# Patient Record
Sex: Male | Born: 1960 | Race: White | Hispanic: No | Marital: Single | Smoking: Former smoker
Health system: Southern US, Community
[De-identification: ages and names within clinical notes are randomized; demographics above are authoritative.]

## PROBLEM LIST (undated history)

## (undated) DIAGNOSIS — Z860100 Personal history of colon polyps, unspecified: Secondary | ICD-10-CM

## (undated) DIAGNOSIS — E785 Hyperlipidemia, unspecified: Secondary | ICD-10-CM

## (undated) DIAGNOSIS — I509 Heart failure, unspecified: Secondary | ICD-10-CM

## (undated) DIAGNOSIS — N200 Calculus of kidney: Secondary | ICD-10-CM

## (undated) DIAGNOSIS — E119 Type 2 diabetes mellitus without complications: Secondary | ICD-10-CM

## (undated) DIAGNOSIS — M1990 Unspecified osteoarthritis, unspecified site: Secondary | ICD-10-CM

## (undated) DIAGNOSIS — K7689 Other specified diseases of liver: Secondary | ICD-10-CM

## (undated) DIAGNOSIS — N201 Calculus of ureter: Secondary | ICD-10-CM

## (undated) DIAGNOSIS — I4891 Unspecified atrial fibrillation: Secondary | ICD-10-CM

## (undated) HISTORY — PX: HERNIA REPAIR: SHX51

## (undated) HISTORY — DX: Other specified diseases of liver: K76.89

## (undated) HISTORY — DX: Heart failure, unspecified: I50.9

## (undated) HISTORY — DX: Personal history of colonic polyps: Z86.010

## (undated) HISTORY — DX: Calculus of ureter: N20.1

## (undated) HISTORY — DX: Unspecified atrial fibrillation: I48.91

## (undated) HISTORY — DX: Personal history of colon polyps, unspecified: Z86.0100

## (undated) HISTORY — DX: Hyperlipidemia, unspecified: E78.5

## (undated) HISTORY — DX: Type 2 diabetes mellitus without complications: E11.9

---

## 2006-02-23 ENCOUNTER — Ambulatory Visit: Payer: Self-pay | Admitting: General Surgery

## 2006-03-08 ENCOUNTER — Ambulatory Visit: Payer: Self-pay | Admitting: General Surgery

## 2006-08-30 ENCOUNTER — Ambulatory Visit: Payer: Self-pay | Admitting: Family Medicine

## 2010-02-20 ENCOUNTER — Ambulatory Visit: Payer: Self-pay | Admitting: Internal Medicine

## 2010-05-14 ENCOUNTER — Ambulatory Visit: Payer: Self-pay | Admitting: Specialist

## 2010-05-23 ENCOUNTER — Ambulatory Visit: Payer: Self-pay | Admitting: Specialist

## 2010-05-23 HISTORY — PX: ROTATOR CUFF REPAIR: SHX139

## 2010-08-29 ENCOUNTER — Ambulatory Visit: Payer: Self-pay | Admitting: General Surgery

## 2010-08-29 ENCOUNTER — Encounter: Payer: Self-pay | Admitting: Cardiovascular Disease

## 2010-08-29 ENCOUNTER — Ambulatory Visit (INDEPENDENT_AMBULATORY_CARE_PROVIDER_SITE_OTHER): Payer: 59 | Admitting: Cardiovascular Disease

## 2010-08-29 VITALS — BP 120/83 | HR 99 | Ht 74.0 in | Wt 257.0 lb

## 2010-08-29 DIAGNOSIS — I4891 Unspecified atrial fibrillation: Secondary | ICD-10-CM

## 2010-08-29 DIAGNOSIS — E119 Type 2 diabetes mellitus without complications: Secondary | ICD-10-CM

## 2010-08-29 DIAGNOSIS — R9431 Abnormal electrocardiogram [ECG] [EKG]: Secondary | ICD-10-CM

## 2010-08-29 HISTORY — PX: COLONOSCOPY: SHX174

## 2010-08-29 MED ORDER — DABIGATRAN ETEXILATE MESYLATE 150 MG PO CAPS: 150.0000 mg | ORAL_CAPSULE | Freq: Two times a day (BID) | ORAL | Status: DC

## 2010-08-29 NOTE — Progress Notes (Signed)
   Patient ID: David Dorsey, male    DOB: [birth year], 50 y.o.   MRN: [id number]  HPI Comments: David Dorsey is a very pleasant 50 year old gentleman with a history of diabetes who presents after completing his colonoscopy at [facility], noted to be in atrial fibrillation.  He reports that he is asymptomatic, no symptoms of shortness of breath, no chest tightness with exertion, no lightheadedness, no lower extremity edema. Overall he feels well. He is tolerating his diabetes medications with no complaints. He had his colonoscopy today and reports having no polyps removed. This is his second colonoscopy for strong family history of GI disease.  He denies having significant coronary disease in the family.  EKG today shows atrial fibrillation with ventricular rate 84 beats per minute, left anterior fascicular block, rare PVC EKG from earlier today during his colonoscopy shows atrial fibrillation with ventricular rate 61 beats per minute EKG from Dr. Theodis Aguas office March 2012 shows normal sinus rhythm with rare APC, left axis deviation  Notes from earlier in the year from Dr. Theodis Aguas office detail hemoglobin A1c 9.2   Outpatient Encounter Prescriptions as of 08/29/2010  Medication Sig Dispense Refill  . aspirin 81 MG tablet Take 81 mg by mouth daily.        . metFORMIN (GLUMETZA) 500 MG (MOD) 24 hr tablet Take 500 mg by mouth 2 (two) times daily with a meal.          Review of Systems  Constitutional: Negative.   HENT: Negative.   Eyes: Negative.   Respiratory: Negative.   Cardiovascular: Negative.   Gastrointestinal: Negative.   Musculoskeletal: Negative.   Skin: Negative.   Neurological: Negative.   Hematological: Negative.   Psychiatric/Behavioral: Negative.   All other systems reviewed and are negative.     BP 120/83  Pulse 99  Ht 6\' 2"  (1.88 m)  Wt 257 lb (116.574 kg)  BMI 33.00 kg/m2  Physical Exam  Nursing note and vitals reviewed. Constitutional: He is oriented to person,  place, and time. He appears well-developed and well-nourished.  HENT:  Head: Normocephalic.  Nose: Nose normal.  Mouth/Throat: Oropharynx is clear and moist.  Eyes: Conjunctivae are normal. Pupils are equal, round, and reactive to light.  Neck: Normal range of motion. Neck supple. No JVD present.  Cardiovascular: Normal rate, S1 normal, S2 normal, normal heart sounds and intact distal pulses.  An irregularly irregular rhythm present. Exam reveals no gallop and no friction rub.   No murmur heard. Pulmonary/Chest: Effort normal and breath sounds normal. No respiratory distress. He has no wheezes. He has no rales. He exhibits no tenderness.  Abdominal: Soft. Bowel sounds are normal. He exhibits no distension. There is no tenderness.  Musculoskeletal: Normal range of motion. He exhibits no edema and no tenderness.  Lymphadenopathy:    He has no cervical adenopathy.  Neurological: He is alert and oriented to person, place, and time. Coordination normal.  Skin: Skin is warm and dry. No rash noted. No erythema.  Psychiatric: He has a normal mood and affect. His behavior is normal. Judgment and thought content normal.           Assessment and Plan

## 2010-08-29 NOTE — Patient Instructions (Addendum)
We will start pradaxa 150 mg twice a day (start Saturday) Stop the aspirin We have ordered an echocardiogram for atrial fibrillation Please call us if you have new issues that need to be addressed before your next appt.  We will call you for a follow up Appt. In 1 months

## 2010-08-29 NOTE — Assessment & Plan Note (Signed)
He is uncertain when his rhythm converted to atrial fibrillation. He denies any symptoms. For now, we will start him on anticoagulation, pradaxa 150 mg b.i.d.. We have ordered an echocardiogram to determine if he would be a candidate for pharmacologic cardioversion or DC cardioversion in one month's time. We will meet him back at that time. All of the pathology and treatment and anticoagulation options were discussed with him at length.

## 2010-08-29 NOTE — Assessment & Plan Note (Signed)
We have encouraged continued exercise, careful diet management in an effort to lose weight. He has followup with Dr. Sherrie Mustache for medication titration.

## 2010-09-06 HISTORY — PX: CARDIOVERSION: SHX1299

## 2010-09-09 ENCOUNTER — Other Ambulatory Visit (INDEPENDENT_AMBULATORY_CARE_PROVIDER_SITE_OTHER): Payer: 59 | Admitting: *Deleted

## 2010-09-09 DIAGNOSIS — R9431 Abnormal electrocardiogram [ECG] [EKG]: Secondary | ICD-10-CM

## 2010-09-09 DIAGNOSIS — I4891 Unspecified atrial fibrillation: Secondary | ICD-10-CM

## 2010-09-18 ENCOUNTER — Encounter: Payer: Self-pay | Admitting: Cardiovascular Disease

## 2010-09-18 ENCOUNTER — Telehealth: Payer: Self-pay | Admitting: *Deleted

## 2010-09-18 NOTE — Telephone Encounter (Signed)
Pt called to get echo results from 1 week ago. Pt was sent letter, however, the address was incorrect. Gave pt results per letter, changed info. Pt will follow up 10/01/10 to discuss results further with Dr. Mariah Milling.

## 2010-10-01 ENCOUNTER — Encounter: Payer: Self-pay | Admitting: Cardiovascular Disease

## 2010-10-01 ENCOUNTER — Ambulatory Visit (INDEPENDENT_AMBULATORY_CARE_PROVIDER_SITE_OTHER): Payer: 59 | Admitting: Cardiovascular Disease

## 2010-10-01 VITALS — BP 126/89 | HR 99 | Ht 74.0 in | Wt 260.0 lb

## 2010-10-01 DIAGNOSIS — I4891 Unspecified atrial fibrillation: Secondary | ICD-10-CM

## 2010-10-01 DIAGNOSIS — E119 Type 2 diabetes mellitus without complications: Secondary | ICD-10-CM

## 2010-10-01 MED ORDER — AMIODARONE HCL 200 MG PO TABS: 200.0000 mg | ORAL_TABLET | Freq: Two times a day (BID) | ORAL | Status: DC

## 2010-10-01 NOTE — Assessment & Plan Note (Signed)
In an effort to pharmacologically cardiovert him, we will start him on amiodarone 200 mg b.i.d. We will schedule him today for cardioversion at the end of next week. If he has not converted, the amiodarone would certainly help [place] with cardioversion. We'll continue him on anticoagulation for several weeks after cardioversion.

## 2010-10-01 NOTE — Patient Instructions (Signed)
Please start amiodarone 200 mg twice a day We will call you to schedule the cardioversion at the end of next week  Please call us if you have new issues that need to be addressed before your next appt.  We will call you for a follow up Appt. In 1 months

## 2010-10-01 NOTE — Progress Notes (Signed)
   Patient ID: David Dorsey, male    DOB: 1960/05/24, 50 y.o.   MRN: [id number]  HPI Comments: David Dorsey is a very pleasant 50 year old gentleman with a history of diabetes who presents after completing his colonoscopy at [facility], noted to be in atrial fibrillation.  On his last clinic visit, he did not need any significant medications for rate control as heart rate was relatively well controlled. He is asymptomatic in general. We did start him on anticoagulation one month ago. We did perform an echocardiogram that showed mildly dilated left atrium, moderately reduced systolic function which I suspect could be secondary to his underlying arrhythmia.  He denies having significant coronary disease in the family.  EKG today shows atrial fibrillation with ventricular rate 79 beats per minute, no significant ST or T wave changes, left anterior fascicular block  Notes from earlier in the year from Dr. Theodis Aguas office detail hemoglobin A1c 9.2   Outpatient Encounter Prescriptions as of 10/01/2010  Medication Sig Dispense Refill  . dabigatran (PRADAXA) 150 MG CAPS Take 1 capsule (150 mg total) by mouth every 12 (twelve) hours.  60 capsule  6  . metFORMIN (GLUMETZA) 500 MG (MOD) 24 hr tablet Take 500 mg by mouth 2 (two) times daily with a meal.        . DISCONTD: aspirin 81 MG tablet Take 81 mg by mouth daily.           Review of Systems  Constitutional: Negative.   HENT: Negative.   Eyes: Negative.   Respiratory: Negative.   Cardiovascular: Negative.   Gastrointestinal: Negative.   Musculoskeletal: Negative.   Skin: Negative.   Neurological: Negative.   Hematological: Negative.   Psychiatric/Behavioral: Negative.   All other systems reviewed and are negative.     BP 126/89  Pulse 99  Ht 6\' 2"  (1.88 m)  Wt 260 lb (117.935 kg)  BMI 33.38 kg/m2  Physical Exam  Nursing note and vitals reviewed. Constitutional: He is oriented to person, place, and time. He appears well-developed and  well-nourished.  HENT:  Head: Normocephalic.  Nose: Nose normal.  Mouth/Throat: Oropharynx is clear and moist.  Eyes: Conjunctivae are normal. Pupils are equal, round, and reactive to light.  Neck: Normal range of motion. Neck supple. No JVD present.  Cardiovascular: Normal rate, S1 normal, S2 normal, normal heart sounds and intact distal pulses.  An irregularly irregular rhythm present. Exam reveals no gallop and no friction rub.   No murmur heard. Pulmonary/Chest: Effort normal and breath sounds normal. No respiratory distress. He has no wheezes. He has no rales. He exhibits no tenderness.  Abdominal: Soft. Bowel sounds are normal. He exhibits no distension. There is no tenderness.  Musculoskeletal: Normal range of motion. He exhibits no edema and no tenderness.  Lymphadenopathy:    He has no cervical adenopathy.  Neurological: He is alert and oriented to person, place, and time. Coordination normal.  Skin: Skin is warm and dry. No rash noted. No erythema.  Psychiatric: He has a normal mood and affect. His behavior is normal. Judgment and thought content normal.           Assessment and Plan

## 2010-10-01 NOTE — Assessment & Plan Note (Signed)
We have encouraged continued exercise, careful diet management in an effort to lose weight. 

## 2010-10-06 ENCOUNTER — Encounter: Payer: Self-pay | Admitting: *Deleted

## 2010-10-06 ENCOUNTER — Other Ambulatory Visit: Payer: Self-pay | Admitting: Cardiovascular Disease

## 2010-10-06 DIAGNOSIS — R9431 Abnormal electrocardiogram [ECG] [EKG]: Secondary | ICD-10-CM

## 2010-10-06 DIAGNOSIS — I4891 Unspecified atrial fibrillation: Secondary | ICD-10-CM

## 2010-10-06 DIAGNOSIS — Z01810 Encounter for preprocedural cardiovascular examination: Secondary | ICD-10-CM

## 2010-10-14 ENCOUNTER — Ambulatory Visit (INDEPENDENT_AMBULATORY_CARE_PROVIDER_SITE_OTHER): Payer: 59 | Admitting: *Deleted

## 2010-10-14 ENCOUNTER — Other Ambulatory Visit: Payer: 59 | Admitting: *Deleted

## 2010-10-14 ENCOUNTER — Encounter: Payer: 59 | Admitting: *Deleted

## 2010-10-14 DIAGNOSIS — I4891 Unspecified atrial fibrillation: Secondary | ICD-10-CM

## 2010-10-14 DIAGNOSIS — R9431 Abnormal electrocardiogram [ECG] [EKG]: Secondary | ICD-10-CM

## 2010-10-14 DIAGNOSIS — Z01810 Encounter for preprocedural cardiovascular examination: Secondary | ICD-10-CM

## 2010-10-14 LAB — BASIC METABOLIC PANEL
BUN: 13 mg/dL (ref 6–23)
CO2: 24 mEq/L (ref 19–32)
Chloride: 107 mEq/L (ref 96–112)
Creat: 0.96 mg/dL (ref 0.50–1.35)
Glucose, Bld: 145 mg/dL — ABNORMAL HIGH (ref 70–99)

## 2010-10-14 LAB — PROTIME-INR: Prothrombin Time: 15.5 seconds — ABNORMAL HIGH (ref 11.6–15.2)

## 2010-10-15 LAB — CBC WITH DIFFERENTIAL/PLATELET
Basophils Relative: 1 % (ref 0–1)
Eosinophils Absolute: 0.2 10*3/uL (ref 0.0–0.7)
MCH: 29 pg (ref 26.0–34.0)
MCHC: 35.3 g/dL (ref 30.0–36.0)
Monocytes Relative: 8 % (ref 3–12)
Neutrophils Relative %: 53 % (ref 43–77)
Platelets: 197 10*3/uL (ref 150–400)
RDW: 13.1 % (ref 11.5–15.5)

## 2010-10-16 ENCOUNTER — Ambulatory Visit: Payer: Self-pay | Admitting: Cardiovascular Disease

## 2010-10-16 DIAGNOSIS — I4891 Unspecified atrial fibrillation: Secondary | ICD-10-CM

## 2010-10-31 ENCOUNTER — Ambulatory Visit: Payer: 59 | Admitting: Cardiovascular Disease

## 2010-11-07 ENCOUNTER — Encounter: Payer: Self-pay | Admitting: Cardiovascular Disease

## 2011-02-04 ENCOUNTER — Encounter: Payer: Self-pay | Admitting: Cardiovascular Disease

## 2011-02-06 ENCOUNTER — Encounter: Payer: Self-pay | Admitting: Cardiovascular Disease

## 2011-02-06 ENCOUNTER — Encounter: Payer: Self-pay | Admitting: *Deleted

## 2011-02-06 ENCOUNTER — Ambulatory Visit (INDEPENDENT_AMBULATORY_CARE_PROVIDER_SITE_OTHER): Payer: 59 | Admitting: Cardiovascular Disease

## 2011-02-06 VITALS — BP 107/73 | HR 110 | Ht 74.0 in | Wt 255.0 lb

## 2011-02-06 DIAGNOSIS — I4891 Unspecified atrial fibrillation: Secondary | ICD-10-CM

## 2011-02-06 DIAGNOSIS — R9431 Abnormal electrocardiogram [ECG] [EKG]: Secondary | ICD-10-CM

## 2011-02-06 DIAGNOSIS — I509 Heart failure, unspecified: Secondary | ICD-10-CM

## 2011-02-06 MED ORDER — CARVEDILOL 3.125 MG PO TABS: 3.1250 mg | ORAL_TABLET | Freq: Two times a day (BID) | ORAL | Status: DC

## 2011-02-06 MED ORDER — DABIGATRAN ETEXILATE MESYLATE 150 MG PO CAPS: 150.0000 mg | ORAL_CAPSULE | Freq: Two times a day (BID) | ORAL | Status: DC

## 2011-02-06 NOTE — Assessment & Plan Note (Signed)
David Dorsey presents with recurrent atrial fibrillation. He thinks that he may have been in atrial fibrillation for the past several months. He suspects that he went back into A. fib shortly after his cardioversion.  He's not particular symptomatic I can't feel that his heart is beating a little regularly. He his rate is fairly well controlled I don't think that it is contributing to any heart failure.   He has an ejection fraction of 35-40% so flecainide is not a good option.  I've asked him to restart his Pradaxa at 150 mg BID.  He may be a candidate for Tikosyn.  At his relatively young age of 47, I would not necessarily want  to commit him to long-term amiodarone therapy.  We'll start him on carvedilol 3.125 mg twice a day which will help with rate control and will also help with his congestive heart failure. He will return to see [place] in the office in several weeks.

## 2011-02-06 NOTE — Assessment & Plan Note (Signed)
His ejection fraction by echo was 35-40%. He has a long history of poorly controlled diabetes mellitus. He's never had any episodes of chest pain but we certainly need to rule out ischemic heart disease.  He may have some ischemia.  We'll schedule him for a 2 day stress Myoview study.  He may need a cardiac catheterization. Dr. Windell Hummingbird original thoughts were that the congestive heart failure was due to rapid atrial fibrillation.  At this point, his rate is fairly well controlled and I do not think that it is contributing to any further decline in his heart function.  We will start him on carvedilol 3.125 mg a day. His blood pressure is marginal and I do not think it we can start him on ACE inhibitor today.  We will gradually titrate up his carvedilol.  I've asked him to limit his salt intake.

## 2011-02-06 NOTE — Patient Instructions (Signed)
Your physician has recommended you make the following change in your medication: Pradaxa 150 TWICE daily.  START Coreg 3.125 TWICE Daily.  IN [place]: Your physician has requested that you have en exercise stress myoview. For further information please visit [email or web address] Please follow instruction sheet, as given.    Your physician recommends that you schedule a follow-up appointment in: 2 WEEKS WITH DR. Mariah Milling

## 2011-02-06 NOTE — Progress Notes (Signed)
    David Dorsey Date of Birth  1960-10-13 [facility]     [place] [facility]  [address]    [address]   [address] [place], [place]  [address]    [place], [place]  [address] [phone]  Fax  [phone]  [phone]  Fax [phone]  Problem list: 1. Atrial fibrillation 2. Diabetes mellitus   History of Present Illness:  David Dorsey is a 51 yo with hx of Atrial Fibrillation in the past.  He also has a hx of DM.  He was seen by his medical doctor and was found to be in atrial fibrillation.  He's not had any severe chest pain or shortness breath but has noticed that he doesn't feel quite as strong as he normally does. He also gets a sensation that his heart is not in rhythm.  He took an amiodarone this morning because he felt that he was out of rhythm.  He works very actively as a Production designer, theatre/television/film at [employer].    Current Outpatient Prescriptions on File Prior to Visit  Medication Sig Dispense Refill  . metFORMIN (GLUMETZA) 500 MG (MOD) 24 hr tablet Take 500 mg by mouth 2 (two) times daily with a meal.          No Known Allergies  Past Medical History  Diagnosis Date  . Diabetes mellitus   . Type II or unspecified type diabetes mellitus without mention of complication, not stated as uncontrolled   . Personal history of colonic polyps   . Calculus of ureter   . Other chronic nonalcoholic liver disease   . Hyperlipidemia   . Atrial fibrillation     Past Surgical History  Procedure Date  . Rotator cuff repair May 23, 2010    right  . Colonoscopy 08/29/2010  . Cardioversion 09/2010    History  Smoking status  . Former Smoker -- 1.0 packs/day for 15 years  . Types: Cigarettes  . Quit date: 08/29/1998  Smokeless tobacco  . Not on file    History  Alcohol Use  . 0.5 oz/week  . 1 drink(s) per week    History reviewed. No pertinent family history.  Reviw of Systems:  Reviewed in the HPI.  All other systems are negative.  Physical  Exam: Blood pressure 107/73, pulse 110, height 6\' 2"  (1.88 m), weight 255 lb (115.667 kg). General: Well developed, well nourished, in no acute distress.  Head: Normocephalic, atraumatic, sclera non-icteric, mucus membranes are moist,   Neck: Supple. Negative for carotid bruits. JVD not elevated.  Lungs: Clear bilaterally to auscultation without wheezes, rales, or rhonchi. Breathing is unlabored.  Heart: Irregularly irregular with S1 S2. No murmurs, rubs, or gallops appreciated.  Abdomen: Soft, non-tender, non-distended with normoactive bowel sounds. No hepatomegaly. No rebound/guarding. No obvious abdominal masses.  Msk:  Strength and tone appear normal for age.  Extremities: No clubbing or cyanosis. No edema.  Distal pedal pulses are 2+ and equal bilaterally.  Neuro: Alert and oriented X 3. Moves all extremities spontaneously.  Psych:  Responds to questions appropriately with a normal affect.  ECG: Atrial fibrillation with a controlled ventricular response  Assessment / Plan:

## 2011-02-24 ENCOUNTER — Ambulatory Visit (HOSPITAL_COMMUNITY): Payer: 59 | Attending: Cardiology | Admitting: Radiology

## 2011-02-24 VITALS — BP 121/89 | Ht 74.0 in | Wt 252.0 lb

## 2011-02-24 DIAGNOSIS — Z87891 Personal history of nicotine dependence: Secondary | ICD-10-CM | POA: Insufficient documentation

## 2011-02-24 DIAGNOSIS — E669 Obesity, unspecified: Secondary | ICD-10-CM | POA: Insufficient documentation

## 2011-02-24 DIAGNOSIS — E119 Type 2 diabetes mellitus without complications: Secondary | ICD-10-CM | POA: Insufficient documentation

## 2011-02-24 DIAGNOSIS — R0989 Other specified symptoms and signs involving the circulatory and respiratory systems: Secondary | ICD-10-CM | POA: Insufficient documentation

## 2011-02-24 DIAGNOSIS — R002 Palpitations: Secondary | ICD-10-CM | POA: Insufficient documentation

## 2011-02-24 DIAGNOSIS — R000 Tachycardia, unspecified: Secondary | ICD-10-CM | POA: Insufficient documentation

## 2011-02-24 DIAGNOSIS — M25539 Pain in unspecified wrist: Secondary | ICD-10-CM | POA: Insufficient documentation

## 2011-02-24 DIAGNOSIS — I4891 Unspecified atrial fibrillation: Secondary | ICD-10-CM

## 2011-02-24 DIAGNOSIS — R0609 Other forms of dyspnea: Secondary | ICD-10-CM | POA: Insufficient documentation

## 2011-02-24 DIAGNOSIS — R0602 Shortness of breath: Secondary | ICD-10-CM

## 2011-02-24 DIAGNOSIS — E785 Hyperlipidemia, unspecified: Secondary | ICD-10-CM | POA: Insufficient documentation

## 2011-02-24 DIAGNOSIS — R9431 Abnormal electrocardiogram [ECG] [EKG]: Secondary | ICD-10-CM

## 2011-02-24 MED ORDER — TECHNETIUM TC 99M TETROFOSMIN IV KIT
30.0000 | PACK | Freq: Once | INTRAVENOUS | Status: AC | PRN
  Administered 2011-02-24: 30 via INTRAVENOUS

## 2011-02-24 MED ORDER — TECHNETIUM TC 99M TETROFOSMIN IV KIT
10.0000 | PACK | Freq: Once | INTRAVENOUS | Status: AC | PRN
  Administered 2011-02-24: 10 via INTRAVENOUS

## 2011-02-24 NOTE — Progress Notes (Signed)
[facility] SITE 3 NUCLEAR MED [address] [place] [address] [phone]  Cardiology Nuclear Med Study  Montez Cuda Fakhouri is a 51 y.o. male [id number] 02-24-1960   Nuclear Med Background Indication for Stress Test:  Evaluation for Ischemia History:  Atrial Fibrillation; CHF; 9/12 Cardioversion; and 9/12 Echo:EF=35-40% Cardiac Risk Factors: History of Smoking, Lipids, NIDDM and Obesity  Symptoms:  DOE, Palpitations and Rapid HR   Nuclear Pre-Procedure Caffeine/Decaff Intake:  None NPO After: 7:00pm   Lungs:  Clear. IV 0.9% NS with Angio Cath:  20g  IV Site: R Antecubital  IV Started by:  Stanton Kidney, EMT-P  Chest Size (in):  48 Cup Size: n/a  Height: 6\' 2"  (1.88 m)  Weight:  252 lb (114.306 kg)  BMI:  Body mass index is 32.35 kg/(m^2). Tech Comments:  Carvedilol held > 17 hours, per patient.  CBG= 133 @ 6:45 am, per patient.    Nuclear Med Study 1 or 2 day study: 1 day  Stress Test Type:  Stress  Reading MD: Olga Millers, MD  Order Authorizing Provider:  Julien Nordmann, MD  Resting Radionuclide: Technetium 69m Tetrofosmin  Resting Radionuclide Dose: 11.0 mCi   Stress Radionuclide:  Technetium 96m Tetrofosmin  Stress Radionuclide Dose: 33.0 mCi           Stress Protocol Rest HR: 73 Stress HR: 193  Rest BP: Sitting:121/89  Standing:118/96 Stress BP: 163/81  Exercise Time (min): 9:15 METS: 10.5   Predicted Max HR: 169 bpm % Max HR: 114.2 bpm Rate Pressure Product: 21308   Dose of Adenosine (mg):  n/a Dose of Lexiscan: n/a mg  Dose of Atropine (mg): n/a Dose of Dobutamine: n/a mcg/kg/min (at max HR)  Stress Test Technologist: Smiley Houseman, CMA-N  Nuclear Technologist:  Doyne Keel, CNMT     Rest Procedure:  Myocardial perfusion imaging was performed at rest 45 minutes following the intravenous administration of Technetium 55m Tetrofosmin.  Rest ECG: Atrial Fibrilliation, nonspecific ST-T wave changes and occasional PVC's.  Stress Procedure:   The patient exercised for 9:15 on the treadmill utilizing the Bruce protocol.  The patient stopped due to fatigue and denied any chest pain.  There were no diagnostic ST-T wave changes.  There were occasional PVC's and he did show a hypertensive response 3-minutes into recovery, 175/109.   Technetium 40m Tetrofosmin was injected at peak exercise and myocardial perfusion imaging was performed after a brief delay.  Stress ECG: No significant ST segment change suggestive of ischemia.  QPS Raw Data Images:  Acquisition technically good; normal left ventricular size. Stress Images:  There is decreased uptake in the inferior wall. Rest Images:  There is decreased uptake in the inferior wall, less prominent compared to the stress images. 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 Transient Ischemic Dilatation (Normal <1.22):  0.94 Lung/Heart Ratio (Normal <0.45):  0.36  Quantitative Gated Spect Images QGS EDV:  n/a QGS ESV:  n/a QGS cine images:  Study not gated QGS EF: Study not gated  Impression Exercise Capacity:  Good exercise capacity. BP Response:  Normal blood pressure response. Clinical Symptoms:  No chest pain. ECG Impression:  No significant ST segment change suggestive of ischemia; patient in atrial fibrillation during the study. 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  Olga Millers

## 2011-02-25 ENCOUNTER — Encounter: Payer: Self-pay | Admitting: *Deleted

## 2011-02-26 ENCOUNTER — Ambulatory Visit (INDEPENDENT_AMBULATORY_CARE_PROVIDER_SITE_OTHER): Payer: 59 | Admitting: Cardiovascular Disease

## 2011-02-26 ENCOUNTER — Encounter: Payer: Self-pay | Admitting: Cardiovascular Disease

## 2011-02-26 VITALS — BP 118/88 | HR 76 | Ht 74.0 in | Wt 254.8 lb

## 2011-02-26 DIAGNOSIS — I509 Heart failure, unspecified: Secondary | ICD-10-CM

## 2011-02-26 DIAGNOSIS — E785 Hyperlipidemia, unspecified: Secondary | ICD-10-CM | POA: Insufficient documentation

## 2011-02-26 DIAGNOSIS — R9439 Abnormal result of other cardiovascular function study: Secondary | ICD-10-CM | POA: Insufficient documentation

## 2011-02-26 DIAGNOSIS — I4891 Unspecified atrial fibrillation: Secondary | ICD-10-CM

## 2011-02-26 DIAGNOSIS — E119 Type 2 diabetes mellitus without complications: Secondary | ICD-10-CM

## 2011-02-26 MED ORDER — ATORVASTATIN CALCIUM 10 MG PO TABS: 10.0000 mg | ORAL_TABLET | Freq: Every day | ORAL | Status: DC

## 2011-02-26 MED ORDER — AMIODARONE HCL 200 MG PO TABS: 200.0000 mg | ORAL_TABLET | Freq: Two times a day (BID) | ORAL | Status: DC

## 2011-02-26 NOTE — Assessment & Plan Note (Signed)
He reports having elevated cholesterol. Given he is diabetic, abnormal stress test, we will treat him aggressively. We'll start Lipitor 10 mg daily. We can check his cholesterol in 3 months time.

## 2011-02-26 NOTE — Assessment & Plan Note (Signed)
We have encouraged continued exercise, careful diet management in an effort to lose weight. 

## 2011-02-26 NOTE — Progress Notes (Signed)
Patient ID: David Dorsey, male    DOB: 1960-04-21, 51 y.o.   MRN: [id number]  HPI Comments: David Dorsey is a very pleasant 51 year old gentleman with a history of diabetes, h/o atrial fibrillation, status post cardioversion several months ago, weaned off amiodarone with recurrence of his atrial fibrillation, sent for stress test in [place] that showed predominantly fixed defect in the inferior wall with mild reversibility, who presents for routine followup.  He reports that overall he feels well. He denies any significant  chest pain. He can feel some palpitations. He is concerned about the low ejection fraction on echocardiogram in September estimated at 30-35%. Also concerned about the findings of the stress test. He is most bothered by the atrial fibrillation and palpitations, possibly mild shortness of breath with exertion.   He denies having significant coronary disease in the family.  EKG today shows atrial fibrillation with ventricular rate 76 beats per minute, no significant ST or T wave changes, left anterior fascicular block  Notes from earlier in the year from Dr. Theodis Aguas office detail hemoglobin A1c 9.2   Outpatient Encounter Prescriptions as of 02/26/2011  Medication Sig Dispense Refill  . aspirin 81 MG tablet Take 81 mg by mouth daily.       . carvedilol (COREG) 3.125 MG tablet Take 1 tablet (3.125 mg total) by mouth 2 (two) times daily.  60 tablet  11  . dabigatran (PRADAXA) 150 MG CAPS Take 1 capsule (150 mg total) by mouth every 12 (twelve) hours.  60 capsule  6  . metFORMIN (GLUMETZA) 500 MG (MOD) 24 hr tablet Take 500 mg by mouth 2 (two) times daily with a meal.        . atorvastatin (LIPITOR) 10 MG tablet Take 1 tablet (10 mg total) by mouth daily.  30 tablet  11    Review of Systems  Constitutional: Negative.   HENT: Negative.   Eyes: Negative.   Respiratory: Negative.   Cardiovascular: Positive for palpitations.  Gastrointestinal: Negative.   Musculoskeletal:  Negative.   Skin: Negative.   Neurological: Negative.   Hematological: Negative.   Psychiatric/Behavioral: Negative.   All other systems reviewed and are negative.     BP 118/88  Pulse 76  Ht 6\' 2"  (1.88 m)  Wt 254 lb 12.8 oz (115.577 kg)  BMI 32.71 kg/m2  Physical Exam  Nursing note and vitals reviewed. Constitutional: He is oriented to person, place, and time. He appears well-developed and well-nourished.  HENT:  Head: Normocephalic.  Nose: Nose normal.  Mouth/Throat: Oropharynx is clear and moist.  Eyes: Conjunctivae are normal. Pupils are equal, round, and reactive to light.  Neck: Normal range of motion. Neck supple. No JVD present.  Cardiovascular: Normal rate, S1 normal, S2 normal, normal heart sounds and intact distal pulses.  An irregularly irregular rhythm present. Exam reveals no gallop and no friction rub.   No murmur heard. Pulmonary/Chest: Effort normal and breath sounds normal. No respiratory distress. He has no wheezes. He has no rales. He exhibits no tenderness.  Abdominal: Soft. Bowel sounds are normal. He exhibits no distension. There is no tenderness.  Musculoskeletal: Normal range of motion. He exhibits no edema and no tenderness.  Lymphadenopathy:    He has no cervical adenopathy.  Neurological: He is alert and oriented to person, place, and time. Coordination normal.  Skin: Skin is warm and dry. No rash noted. No erythema.  Psychiatric: He has a normal mood and affect. His behavior is normal. Judgment and thought content  normal.           Assessment and Plan

## 2011-02-26 NOTE — Assessment & Plan Note (Signed)
Stress test shows only fixed defect with possible mild ischemia. As he is asymptomatic, he is not particularly interested in cardiac catheterization at this time. We'll continue to monitor him closely.

## 2011-02-26 NOTE — Assessment & Plan Note (Signed)
No evidence of systolic CHF on today's visit. I suspect his low ejection fraction is secondary to underlying arrhythmia. Would repeat echocardiogram after he restored normal sinus rhythm.

## 2011-02-26 NOTE — Patient Instructions (Signed)
Please start amiodarone 200 mg twice a day Please come in for an ekg in 2 weeks  Please call us if you have new issues that need to be addressed before your next appt.  Your physician wants you to follow-up in: 1 months.  You will receive a reminder letter in the mail two months in advance. If you don't receive a letter, please call our office to schedule the follow-up appointment.

## 2011-02-26 NOTE — Assessment & Plan Note (Signed)
He is interested in trying cardioversion again. We will restart the amiodarone 200 mg twice a day, check an EKG in 2 weeks' time. If he remains in atrial fibrillation, we will reschedule a cardioversion and continue low-dose amiodarone after the procedure.

## 2011-03-03 ENCOUNTER — Telehealth: Payer: Self-pay

## 2011-03-03 NOTE — Telephone Encounter (Signed)
Prior Authorization approved for atorvastatin effective Feb. 25, 2013 to Feb. 25, 2014. Patient notified and pharmacy aware.

## 2011-03-11 ENCOUNTER — Ambulatory Visit (INDEPENDENT_AMBULATORY_CARE_PROVIDER_SITE_OTHER): Payer: 59

## 2011-03-11 VITALS — HR 75 | Resp 16

## 2011-03-11 DIAGNOSIS — I4891 Unspecified atrial fibrillation: Secondary | ICD-10-CM

## 2011-03-11 NOTE — Progress Notes (Signed)
Pt came in today for an EKG due to starting amiodarone 200 mg 1 tab twice daily as of 02/26/11, per Dr. Windell Hummingbird last ov note 02/26/11. EKG performed today and still shows A-fib heart rate 75, I showed Dr. Mariah Milling the EKG and discussed with Dr. Mariah Milling pt's options. Dr. Windell Hummingbird recommendations were to either give the amiodarone a little more time to work and see if his heart will go back into rhythm on it's own and if not then, the other option is to have a cardioversion. I presented these options to the pt per Dr. Mariah Milling and pt decided today that he would like to give the medication a little more time before proceeding to cardioversion. Pt states he will discuss further with Dr. Mariah Milling @ his next appt 03/26/11 @ 10:15. I will send this note to Dr. Mariah Milling today for review. Danielle Rankin, CMA (AAMA)

## 2011-03-25 NOTE — Patient Instructions (Signed)
Stay on amiodarone for now Follow up in clinic

## 2011-03-26 ENCOUNTER — Ambulatory Visit (INDEPENDENT_AMBULATORY_CARE_PROVIDER_SITE_OTHER): Payer: 59 | Admitting: Cardiovascular Disease

## 2011-03-26 ENCOUNTER — Encounter: Payer: Self-pay | Admitting: Cardiovascular Disease

## 2011-03-26 ENCOUNTER — Other Ambulatory Visit: Payer: Self-pay

## 2011-03-26 VITALS — BP 150/75 | HR 76 | Ht 74.0 in | Wt 260.8 lb

## 2011-03-26 DIAGNOSIS — E119 Type 2 diabetes mellitus without complications: Secondary | ICD-10-CM

## 2011-03-26 DIAGNOSIS — I4891 Unspecified atrial fibrillation: Secondary | ICD-10-CM

## 2011-03-26 DIAGNOSIS — R9431 Abnormal electrocardiogram [ECG] [EKG]: Secondary | ICD-10-CM

## 2011-03-26 DIAGNOSIS — I509 Heart failure, unspecified: Secondary | ICD-10-CM

## 2011-03-26 DIAGNOSIS — G4733 Obstructive sleep apnea (adult) (pediatric): Secondary | ICD-10-CM | POA: Insufficient documentation

## 2011-03-26 MED ORDER — CARVEDILOL 12.5 MG PO TABS: 12.5000 mg | ORAL_TABLET | Freq: Two times a day (BID) | ORAL | Status: DC

## 2011-03-26 NOTE — Progress Notes (Signed)
Patient ID: David Dorsey, male    DOB: [birth year], 51 y.o.   MRN: [id number]  HPI Comments: David Dorsey is a very pleasant 51 year old gentleman with a history of diabetes, h/o atrial fibrillation, status post cardioversion, weaned off amiodarone with recurrence of his atrial fibrillation, sent for stress test in [place] that showed predominantly fixed defect in the inferior wall with mild reversibility, who presents for routine followup.   low ejection fraction on echocardiogram in September estimated at 30-35%.He is most bothered by the atrial fibrillation and palpitations, possibly mild shortness of breath with exertion. He would like to set up cardioversion when he returns from vacation.  EKG today shows atrial fibrillation with ventricular rate 70 beats per minute, no significant ST or T wave changes,  He reports last hemoglobin A1c in the 7 range. He is trying to eat better.   Outpatient Encounter Prescriptions as of 03/26/2011  Medication Sig Dispense Refill  . amiodarone (PACERONE) 200 MG tablet Take 1 tablet (200 mg total) by mouth 2 (two) times daily.  60 tablet  6  . aspirin 81 MG tablet Take 81 mg by mouth daily.       Marland Kitchen atorvastatin (LIPITOR) 10 MG tablet Take 1 tablet (10 mg total) by mouth daily.  30 tablet  11  . dabigatran (PRADAXA) 150 MG CAPS Take 1 capsule (150 mg total) by mouth every 12 (twelve) hours.  60 capsule  6  . metFORMIN (GLUMETZA) 500 MG (MOD) 24 hr tablet Take 500 mg by mouth 2 (two) times daily with a meal.        .  carvedilol (COREG) 3.125 MG tablet Take 1 tablet (3.125 mg total) by mouth 2 (two) times daily.  60 tablet  11     Review of Systems  Constitutional: Negative.   HENT: Negative.   Eyes: Negative.   Respiratory: Positive for shortness of breath.   Cardiovascular: Positive for palpitations.  Gastrointestinal: Negative.   Musculoskeletal: Negative.   Skin: Negative.   Neurological: Negative.   Hematological: Negative.     Psychiatric/Behavioral: Negative.   All other systems reviewed and are negative.     BP 150/75  Pulse 76  Ht 6\' 2"  (1.88 m)  Wt 260 lb 12.8 oz (118.298 kg)  BMI 33.48 kg/m2  Physical Exam  Nursing note and vitals reviewed. Constitutional: He is oriented to person, place, and time. He appears well-developed and well-nourished.  HENT:  Head: Normocephalic.  Nose: Nose normal.  Mouth/Throat: Oropharynx is clear and moist.  Eyes: Conjunctivae are normal. Pupils are equal, round, and reactive to light.  Neck: Normal range of motion. Neck supple. No JVD present.  Cardiovascular: Normal rate, S1 normal, S2 normal, normal heart sounds and intact distal pulses.  An irregularly irregular rhythm present. Exam reveals no gallop and no friction rub.   No murmur heard. Pulmonary/Chest: Effort normal and breath sounds normal. No respiratory distress. He has no wheezes. He has no rales. He exhibits no tenderness.  Abdominal: Soft. Bowel sounds are normal. He exhibits no distension. There is no tenderness.  Musculoskeletal: Normal range of motion. He exhibits no edema and no tenderness.  Lymphadenopathy:    He has no cervical adenopathy.  Neurological: He is alert and oriented to person, place, and time. Coordination normal.  Skin: Skin is warm and dry. No rash noted. No erythema.  Psychiatric: He has a normal mood and affect. His behavior is normal. Judgment and thought content normal.  Assessment and Plan

## 2011-03-26 NOTE — Assessment & Plan Note (Signed)
Symptoms concerning for obstructive sleep apnea. We have talked to him about this. If he converts to atrial fibrillation again after the next cardioversion, he will do a sleep study. Ideally he should probably do this anyway but he prefers to wait for now.

## 2011-03-26 NOTE — Progress Notes (Signed)
Patient seeing Dr. Mariah Milling today.

## 2011-03-26 NOTE — Assessment & Plan Note (Signed)
We'll schedule him for a cardioversion in April at his convenience. We have suggested he continue on a meal around 200 mg twice a day. We'll increase the Coreg to 6.25 mg twice a day. When he returns from vacation, we have suggested he increase the carvedilol to 12.5 mg twice a day. After the cardioversion, we will keep him on low-dose amiodarone and higher dose carvedilol.

## 2011-03-26 NOTE — Patient Instructions (Addendum)
You are doing well. Please increase the coreg to 6.25 mg twice a day (1/2 of the 12.5 mg pill) When you get back from vacation, increase the coreg to a full pill twice a day Continue on the amiodarone twice a day  We will set up a cardioversion for April 16, 2011 at 7:30am. need to arrive at 6:30 am. Do not eat or drink anything after midnight the night before your cardioversion. Hold the metformin the am of procedure and resume after procedure. You will report to the medical mall entrance at [facility]. The patient will call the office to let us know that the above date for the cardioversion is okay.  Please call us if you have new issues that need to be addressed before your next appt.  Your physician wants you to follow-up in: 2 months.  You will receive a reminder letter in the mail two months in advance. If you don't receive a letter, please call our office to schedule the follow-up appointment.

## 2011-03-26 NOTE — Assessment & Plan Note (Signed)
We have encouraged continued exercise, careful diet management in an effort to lose weight. 

## 2011-04-20 ENCOUNTER — Ambulatory Visit (INDEPENDENT_AMBULATORY_CARE_PROVIDER_SITE_OTHER): Payer: 59

## 2011-04-20 DIAGNOSIS — Z5181 Encounter for therapeutic drug level monitoring: Secondary | ICD-10-CM

## 2011-04-20 DIAGNOSIS — Z01810 Encounter for preprocedural cardiovascular examination: Secondary | ICD-10-CM

## 2011-04-20 DIAGNOSIS — Z01818 Encounter for other preprocedural examination: Secondary | ICD-10-CM

## 2011-04-20 DIAGNOSIS — I4891 Unspecified atrial fibrillation: Secondary | ICD-10-CM

## 2011-04-21 LAB — BASIC METABOLIC PANEL
BUN/Creatinine Ratio: 22 — ABNORMAL HIGH (ref 9–20)
Calcium: 8.8 mg/dL (ref 8.7–10.2)
Creatinine, Ser: 1.06 mg/dL (ref 0.76–1.27)
GFR calc non Af Amer: 81 mL/min/{1.73_m2} (ref >59)
Sodium: 137 mmol/L (ref 134–144)

## 2011-04-21 LAB — CBC WITH DIFFERENTIAL/PLATELET
Basos: 0 % (ref 0–3)
Eos: 2 % (ref 0–7)
Immature Grans (Abs): 0 10*3/uL (ref 0.0–0.1)
Lymphocytes Absolute: 2.4 10*3/uL (ref 0.7–4.5)
MCV: 84 fL (ref 79–97)
Monocytes Absolute: 0.4 10*3/uL (ref 0.1–1.0)

## 2011-04-24 ENCOUNTER — Ambulatory Visit: Payer: Self-pay | Admitting: Cardiovascular Disease

## 2011-04-24 DIAGNOSIS — I4892 Unspecified atrial flutter: Secondary | ICD-10-CM

## 2011-05-04 ENCOUNTER — Other Ambulatory Visit: Payer: Self-pay | Admitting: Cardiovascular Disease

## 2011-05-04 DIAGNOSIS — I4891 Unspecified atrial fibrillation: Secondary | ICD-10-CM

## 2011-05-15 ENCOUNTER — Encounter: Payer: Self-pay | Admitting: Cardiovascular Disease

## 2011-05-15 ENCOUNTER — Ambulatory Visit (INDEPENDENT_AMBULATORY_CARE_PROVIDER_SITE_OTHER): Payer: 59 | Admitting: Cardiovascular Disease

## 2011-05-15 VITALS — BP 112/72 | HR 50 | Ht 74.0 in | Wt 257.0 lb

## 2011-05-15 DIAGNOSIS — G4733 Obstructive sleep apnea (adult) (pediatric): Secondary | ICD-10-CM

## 2011-05-15 DIAGNOSIS — I4891 Unspecified atrial fibrillation: Secondary | ICD-10-CM

## 2011-05-15 DIAGNOSIS — I420 Dilated cardiomyopathy: Secondary | ICD-10-CM | POA: Insufficient documentation

## 2011-05-15 DIAGNOSIS — E119 Type 2 diabetes mellitus without complications: Secondary | ICD-10-CM

## 2011-05-15 DIAGNOSIS — I428 Other cardiomyopathies: Secondary | ICD-10-CM

## 2011-05-15 MED ORDER — ENALAPRIL MALEATE 5 MG PO TABS: 5.0000 mg | ORAL_TABLET | Freq: Every day | ORAL | Status: DC

## 2011-05-15 NOTE — Assessment & Plan Note (Signed)
He continues to snore, sleeps on his side. He would like to hold off on his sleep apnea testing at this time.

## 2011-05-15 NOTE — Assessment & Plan Note (Addendum)
Maintaining normal sinus rhythm. We will continue his current medications. If he continues to maintain normal sinus rhythm with improvement of his ejection fraction on echocardiogram, we could hold his anticoagulation (pradaxa).

## 2011-05-15 NOTE — Patient Instructions (Signed)
You are doing well. Please start enalapril one a day Continue your other medications.  We will schedule you for an echocardiogram to look at the heart function  Please call us if you have new issues that need to be addressed before your next appt.  Your physician wants you to follow-up in: 6 months.  You will receive a reminder letter in the mail two months in advance. If you don't receive a letter, please call our office to schedule the follow-up appointment.

## 2011-05-15 NOTE — Assessment & Plan Note (Signed)
Suspect the previously noted cardiopathy with low ejection fraction could have been secondary to arrhythmia. Now that he is in normal sinus rhythm, we will reevaluate his ejection fraction with echocardiogram. We'll add ACE inhibitor with echocardiogram in one month.

## 2011-05-15 NOTE — Progress Notes (Signed)
Patient ID: David Dorsey, male    DOB: [birth year], 51 y.o.   MRN: [id number]  HPI Comments: David Dorsey is a very pleasant 51 year old gentleman with a history of diabetes, h/o atrial fibrillation, status post cardioversion, weaned off amiodarone with recurrence of his atrial fibrillation, sent for stress test in [facility] that showed predominantly fixed defect in the inferior wall with mild reversibility, recurrence of his atrial fibrillation with recent cardioversion at [facility].   We have maintained amiodarone, carvedilol. David Dorsey ran out of his carvedilol and does report having irregular rhythm for approximately 1 day. David Dorsey restarted his medications and reports his normal rhythm was restored. David Dorsey has felt well otherwise with no significant shortness of breath or fatigue.  Previous echocardiogram showed low ejection fraction 30-35%  EKG today shows sinus bradycardia with rate 50 beats per minute, no significant ST or T wave changes  last hemoglobin A1c in the 7 range. David Dorsey is trying to eat better.  Outpatient Encounter Prescriptions as of 05/15/2011  Medication Sig Dispense Refill  . amiodarone (PACERONE) 200 MG tablet Take 1 tablet (200 mg total) by mouth 2 (two) times daily.  60 tablet  6  . aspirin 81 MG tablet Take 81 mg by mouth daily.       . carvedilol (COREG) 12.5 MG tablet Take 1 tablet (12.5 mg total) by mouth 2 (two) times daily.  60 tablet  11  . dabigatran (PRADAXA) 150 MG CAPS Take 1 capsule (150 mg total) by mouth every 12 (twelve) hours.  60 capsule  6  . metFORMIN (GLUMETZA) 500 MG (MOD) 24 hr tablet Take 500 mg by mouth 2 (two) times daily with a meal.        . enalapril (VASOTEC) 5 MG tablet Take 1 tablet (5 mg total) by mouth daily.  30 tablet  11  . DISCONTD: atorvastatin (LIPITOR) 10 MG tablet Take 1 tablet (10 mg total) by mouth daily.  30 tablet  11   Review of Systems  Constitutional: Negative.   HENT: Negative.   Eyes: Negative.   Gastrointestinal: Negative.     Musculoskeletal: Negative.   Skin: Negative.   Neurological: Negative.   Hematological: Negative.   Psychiatric/Behavioral: Negative.   All other systems reviewed and are negative.     BP 112/72  Pulse 50  Ht 6\' 2"  (1.88 m)  Wt 257 lb (116.574 kg)  BMI 33.00 kg/m2  Physical Exam  Nursing note and vitals reviewed. Constitutional: David Dorsey is oriented to person, place, and time. David Dorsey appears well-developed and well-nourished.  HENT:  Head: Normocephalic.  Nose: Nose normal.  Mouth/Throat: Oropharynx is clear and moist.  Eyes: Conjunctivae are normal. Pupils are equal, round, and reactive to light.  Neck: Normal range of motion. Neck supple. No JVD present.  Cardiovascular: Normal rate, S1 normal, S2 normal, normal heart sounds and intact distal pulses.  An irregularly irregular rhythm present. Exam reveals no gallop and no friction rub.   No murmur heard. Pulmonary/Chest: Effort normal and breath sounds normal. No respiratory distress. David Dorsey has no wheezes. David Dorsey has no rales. David Dorsey exhibits no tenderness.  Abdominal: Soft. Bowel sounds are normal. David Dorsey exhibits no distension. There is no tenderness.  Musculoskeletal: Normal range of motion. David Dorsey exhibits no edema and no tenderness.  Lymphadenopathy:    David Dorsey has no cervical adenopathy.  Neurological: David Dorsey is alert and oriented to person, place, and time. Coordination normal.  Skin: Skin is warm and dry. No rash noted. No erythema.  Psychiatric: David Dorsey has  a normal mood and affect. His behavior is normal. Judgment and thought content normal.           Assessment and Plan

## 2011-05-15 NOTE — Assessment & Plan Note (Signed)
We have encouraged continued exercise, careful diet management in an effort to lose weight. 

## 2011-06-09 ENCOUNTER — Other Ambulatory Visit: Payer: 59

## 2011-06-29 ENCOUNTER — Other Ambulatory Visit (INDEPENDENT_AMBULATORY_CARE_PROVIDER_SITE_OTHER): Payer: 59

## 2011-06-29 ENCOUNTER — Other Ambulatory Visit: Payer: Self-pay

## 2011-06-29 DIAGNOSIS — I4891 Unspecified atrial fibrillation: Secondary | ICD-10-CM

## 2011-06-29 DIAGNOSIS — I428 Other cardiomyopathies: Secondary | ICD-10-CM

## 2011-07-20 ENCOUNTER — Ambulatory Visit (INDEPENDENT_AMBULATORY_CARE_PROVIDER_SITE_OTHER): Payer: 59 | Admitting: Cardiovascular Disease

## 2011-07-20 ENCOUNTER — Encounter: Payer: Self-pay | Admitting: Cardiovascular Disease

## 2011-07-20 VITALS — BP 104/64 | HR 49 | Ht 74.0 in | Wt 257.0 lb

## 2011-07-20 DIAGNOSIS — E119 Type 2 diabetes mellitus without complications: Secondary | ICD-10-CM

## 2011-07-20 DIAGNOSIS — E785 Hyperlipidemia, unspecified: Secondary | ICD-10-CM

## 2011-07-20 DIAGNOSIS — G4733 Obstructive sleep apnea (adult) (pediatric): Secondary | ICD-10-CM

## 2011-07-20 DIAGNOSIS — I4891 Unspecified atrial fibrillation: Secondary | ICD-10-CM

## 2011-07-20 DIAGNOSIS — R9431 Abnormal electrocardiogram [ECG] [EKG]: Secondary | ICD-10-CM

## 2011-07-20 MED ORDER — AMIODARONE HCL 200 MG PO TABS: 200.0000 mg | ORAL_TABLET | Freq: Two times a day (BID) | ORAL | Status: DC | PRN

## 2011-07-20 MED ORDER — DABIGATRAN ETEXILATE MESYLATE 150 MG PO CAPS: 150.0000 mg | ORAL_CAPSULE | Freq: Two times a day (BID) | ORAL | Status: DC | PRN

## 2011-07-20 NOTE — Assessment & Plan Note (Signed)
We will try to obtain routine blood work after he sees Dr. Sherrie Mustache for annual visit.

## 2011-07-20 NOTE — Assessment & Plan Note (Signed)
He does not feel that his sleep is being disrupted by any snoring and is not an issue at this time.

## 2011-07-20 NOTE — Assessment & Plan Note (Addendum)
We have suggested he decrease his amiodarone to 200 mg daily. He can hold his anticoagulation as he is maintaining normal sinus rhythm. We have asked him to confirm he is maintaining sinus rhythm on a daily basis by palpation. If he converts back to atrial fibrillation, he is to restart his anticoagulation can call the office.   Recent ejection fraction is normal at greater than 55%, June 2013

## 2011-07-20 NOTE — Progress Notes (Signed)
Patient ID: David Dorsey, male    DOB: [birth year], 51 y.o.   MRN: [id number]  HPI Comments: David Dorsey is a very pleasant 51 year old gentleman with a history of diabetes, h/o atrial fibrillation, status post cardioversion, weaned off amiodarone with recurrence of his atrial fibrillation, sent for stress test in [facility] that showed predominantly fixed defect in the inferior wall with mild reversibility, recurrence of his atrial fibrillation with repeat cardioversion at [facility].   He reports he has been doing well with no palpitations or irregular heartbeats. Overall has no complaints. He does not feel that snoring or sleep apnea has been a problem. He would like to cut down his medications if possible. Some fatigue at the end of the day. Baseline bradycardia.  EKG today shows sinus bradycardia with rate 49 beats per minute, no significant ST or T wave changes  last hemoglobin A1c in the 7 range. He is trying to eat better.  Outpatient Encounter Prescriptions as of 07/20/2011  Medication Sig Dispense Refill  . amiodarone (PACERONE) 200 MG tablet Take 1 tablet (200 mg total) by mouth 2 (two) times daily as needed.  60 tablet  6  . aspirin 81 MG tablet Take 81 mg by mouth daily.       Marland Kitchen atorvastatin (LIPITOR) 10 MG tablet Take 10 mg by mouth daily.      . carvedilol (COREG) 12.5 MG tablet Take 1 tablet (12.5 mg total) by mouth 2 (two) times daily.  60 tablet  11  . dabigatran (PRADAXA) 150 MG CAPS Take 1 capsule (150 mg total) by mouth every 12 (twelve) hours as needed.  60 capsule  6  . enalapril (VASOTEC) 5 MG tablet Take 1 tablet (5 mg total) by mouth daily.  30 tablet  11  . metFORMIN (GLUMETZA) 500 MG (MOD) 24 hr tablet Take 500 mg by mouth 2 (two) times daily with a meal.        . DISCONTD: amiodarone (PACERONE) 200 MG tablet Take 1 tablet (200 mg total) by mouth 2 (two) times daily.  60 tablet  6  . DISCONTD: dabigatran (PRADAXA) 150 MG CAPS Take 1 capsule (150 mg total) by mouth every 12  (twelve) hours.  60 capsule  6   Review of Systems  Constitutional: Negative.   HENT: Negative.   Eyes: Negative.   Respiratory: Negative.   Cardiovascular: Negative.   Gastrointestinal: Negative.   Musculoskeletal: Negative.   Skin: Negative.   Neurological: Negative.   Hematological: Negative.   Psychiatric/Behavioral: Negative.   All other systems reviewed and are negative.    BP 104/64  Pulse 49  Ht 6\' 2"  (1.88 m)  Wt 257 lb (116.574 kg)  BMI 33.00 kg/m2  Physical Exam  Nursing note and vitals reviewed. Constitutional: He is oriented to person, place, and time. He appears well-developed and well-nourished.  HENT:  Head: Normocephalic.  Nose: Nose normal.  Mouth/Throat: Oropharynx is clear and moist.  Eyes: Conjunctivae are normal. Pupils are equal, round, and reactive to light.  Neck: Normal range of motion. Neck supple. No JVD present.  Cardiovascular: Normal rate, S1 normal, S2 normal, normal heart sounds and intact distal pulses.  An irregularly irregular rhythm present. Exam reveals no gallop and no friction rub.   No murmur heard. Pulmonary/Chest: Effort normal and breath sounds normal. No respiratory distress. He has no wheezes. He has no rales. He exhibits no tenderness.  Abdominal: Soft. Bowel sounds are normal. He exhibits no distension. There is no tenderness.  Musculoskeletal: Normal range of motion. He exhibits no edema and no tenderness.  Lymphadenopathy:    He has no cervical adenopathy.  Neurological: He is alert and oriented to person, place, and time. Coordination normal.  Skin: Skin is warm and dry. No rash noted. No erythema.  Psychiatric: He has a normal mood and affect. His behavior is normal. Judgment and thought content normal.           Assessment and Plan

## 2011-07-20 NOTE — Assessment & Plan Note (Signed)
We have encouraged continued exercise, careful diet management in an effort to lose weight. 

## 2011-07-20 NOTE — Patient Instructions (Addendum)
You are doing well. Decrease the amiodarone to one a day (morning) Hold the pradaxa, check your pulse daily for atrial fib Restart pradaxa if you convert back to atrial fib  Stay on coreg twice a day  Please call us if you have new issues that need to be addressed before your next appt.  Your physician wants you to follow-up in: 6 months.  You will receive a reminder letter in the mail two months in advance. If you don't receive a letter, please call our office to schedule the follow-up appointment.

## 2011-12-07 ENCOUNTER — Other Ambulatory Visit: Payer: Self-pay | Admitting: Cardiovascular Disease

## 2011-12-07 MED ORDER — AMIODARONE HCL 200 MG PO TABS: 200.0000 mg | ORAL_TABLET | Freq: Every day | ORAL | Status: DC

## 2011-12-07 NOTE — Telephone Encounter (Signed)
Refilled Amiodarone. 

## 2012-01-18 ENCOUNTER — Ambulatory Visit (INDEPENDENT_AMBULATORY_CARE_PROVIDER_SITE_OTHER): Payer: 59 | Admitting: Cardiovascular Disease

## 2012-01-18 ENCOUNTER — Encounter: Payer: Self-pay | Admitting: Cardiovascular Disease

## 2012-01-18 VITALS — BP 140/80 | HR 49 | Ht 74.0 in | Wt 268.2 lb

## 2012-01-18 DIAGNOSIS — I4891 Unspecified atrial fibrillation: Secondary | ICD-10-CM

## 2012-01-18 DIAGNOSIS — E119 Type 2 diabetes mellitus without complications: Secondary | ICD-10-CM

## 2012-01-18 DIAGNOSIS — E785 Hyperlipidemia, unspecified: Secondary | ICD-10-CM

## 2012-01-18 NOTE — Progress Notes (Signed)
Patient ID: David Dorsey, male    DOB: December 10, 1960, 52 y.o.   MRN: [id number]  HPI Comments: David Dorsey is a very pleasant 52 year old gentleman with a history of diabetes, h/o atrial fibrillation, status post cardioversion, weaned off amiodarone with recurrence of his atrial fibrillation, sent for stress test in [facility] that showed predominantly fixed defect in the inferior wall with mild reversibility, recurrence of his atrial fibrillation with repeat cardioversion at [facility]. Currently on low-dose amiodarone.  He typically runs a low heart rate with no symptoms. Heart rate over the past year has typically been around 50. Overall he feels well with no chest pain, no shortness of breath, no palpitations. He denies any episodes of atrial fibrillation. Overall has no complaints. He is working hard at the [employer] in [place], significant stress.  EKG today shows sinus bradycardia with rate 49 beats per minute, no significant ST or T wave changes January 2013 shows total cholesterol 225, LDL 129. This was to starting Lipitor 10 mg daily  Outpatient Encounter Prescriptions as of 01/18/2012  Medication Sig Dispense Refill  . amiodarone (PACERONE) 200 MG tablet Take 1 tablet (200 mg total) by mouth daily.  30 tablet  3  . aspirin 81 MG tablet Take 81 mg by mouth daily.       Marland Kitchen atorvastatin (LIPITOR) 10 MG tablet Take 10 mg by mouth daily.      . carvedilol (COREG) 12.5 MG tablet Take 1 tablet (12.5 mg total) by mouth 2 (two) times daily.  60 tablet  11  . enalapril (VASOTEC) 5 MG tablet Take 1 tablet (5 mg total) by mouth daily.  30 tablet  11  . meloxicam (MOBIC) 15 MG tablet Take 15 mg by mouth daily.      . metFORMIN (GLUMETZA) 500 MG (MOD) 24 hr tablet Take 500 mg by mouth 2 (two) times daily with a meal.        . [DISCONTINUED] dabigatran (PRADAXA) 150 MG CAPS Take 1 capsule (150 mg total) by mouth every 12 (twelve) hours as needed.  60 capsule  6   Review of Systems  Constitutional:  Negative.   HENT: Negative.   Eyes: Negative.   Respiratory: Negative.   Cardiovascular: Negative.   Gastrointestinal: Negative.   Musculoskeletal: Negative.   Skin: Negative.   Neurological: Negative.   Hematological: Negative.   Psychiatric/Behavioral: Negative.   All other systems reviewed and are negative.    BP 140/80  Ht 6\' 2"  (1.88 m)  Wt 268 lb 4 oz (121.677 kg)  BMI 34.44 kg/m2  Physical Exam  Nursing note and vitals reviewed. Constitutional: He is oriented to person, place, and time. He appears well-developed and well-nourished.  HENT:  Head: Normocephalic.  Nose: Nose normal.  Mouth/Throat: Oropharynx is clear and moist.  Eyes: Conjunctivae normal are normal. Pupils are equal, round, and reactive to light.  Neck: Normal range of motion. Neck supple. No JVD present.  Cardiovascular: Normal rate, S1 normal, S2 normal, normal heart sounds and intact distal pulses.  An irregularly irregular rhythm present. Exam reveals no gallop and no friction rub.   No murmur heard. Pulmonary/Chest: Effort normal and breath sounds normal. No respiratory distress. He has no wheezes. He has no rales. He exhibits no tenderness.  Abdominal: Soft. Bowel sounds are normal. He exhibits no distension. There is no tenderness.  Musculoskeletal: Normal range of motion. He exhibits no edema and no tenderness.  Lymphadenopathy:    He has no cervical adenopathy.  Neurological:  He is alert and oriented to person, place, and time. Coordination normal.  Skin: Skin is warm and dry. No rash noted. No erythema.  Psychiatric: He has a normal mood and affect. His behavior is normal. Judgment and thought content normal.           Assessment and Plan

## 2012-01-18 NOTE — Assessment & Plan Note (Signed)
We have encouraged continued exercise, careful diet management in an effort to lose weight. 

## 2012-01-18 NOTE — Assessment & Plan Note (Signed)
We will recheck his cholesterol today. No known coronary artery disease though indeterminate stress test several years ago.

## 2012-01-18 NOTE — Patient Instructions (Addendum)
You are doing well. No medication changes were made.  Please call us if you have new issues that need to be addressed before your next appt.  Your physician wants you to follow-up in: 12 months.  You will receive a reminder letter in the mail two months in advance. If you don't receive a letter, please call our office to schedule the follow-up appointment. 

## 2012-01-18 NOTE — Assessment & Plan Note (Signed)
No recent episodes of atrial fibrillation. Continue current medications.

## 2012-01-19 LAB — LIPID PANEL
Chol/HDL Ratio: 3.4 ratio units (ref 0.0–5.0)
HDL: 55 mg/dL (ref >39)
Triglycerides: 174 mg/dL — ABNORMAL HIGH (ref 0–149)

## 2012-01-19 LAB — HEPATIC FUNCTION PANEL
ALT: 37 IU/L (ref 0–44)
Bilirubin, Direct: 0.14 mg/dL (ref 0.00–0.40)

## 2012-02-29 ENCOUNTER — Other Ambulatory Visit: Payer: Self-pay | Admitting: Cardiovascular Disease

## 2012-03-28 ENCOUNTER — Other Ambulatory Visit: Payer: Self-pay | Admitting: Cardiovascular Disease

## 2012-03-28 NOTE — Telephone Encounter (Signed)
Pt called states carvedilol needs prior authorization. [facility]

## 2012-03-30 ENCOUNTER — Telehealth: Payer: Self-pay | Admitting: *Deleted

## 2012-03-30 NOTE — Telephone Encounter (Signed)
Pt called stating that the manufacture is out of amiodarone wants to know what he should do.

## 2012-03-30 NOTE — Telephone Encounter (Signed)
No working #'s.

## 2012-04-13 NOTE — Telephone Encounter (Signed)
Tried all #s again Still non-working #s

## 2012-04-25 ENCOUNTER — Other Ambulatory Visit: Payer: Self-pay | Admitting: Cardiovascular Disease

## 2012-07-21 ENCOUNTER — Encounter: Payer: Self-pay | Admitting: *Deleted

## 2012-07-25 ENCOUNTER — Other Ambulatory Visit: Payer: Self-pay | Admitting: Cardiovascular Disease

## 2012-07-25 NOTE — Telephone Encounter (Signed)
Refilled Amiodarone sent to [facility] pharmacy.

## 2012-11-20 ENCOUNTER — Other Ambulatory Visit: Payer: Self-pay | Admitting: Cardiovascular Disease

## 2012-11-21 ENCOUNTER — Other Ambulatory Visit: Payer: Self-pay | Admitting: *Deleted

## 2012-11-21 MED ORDER — ENALAPRIL MALEATE 5 MG PO TABS: ORAL_TABLET | ORAL | Status: DC

## 2012-11-21 NOTE — Telephone Encounter (Signed)
Requested Prescriptions   Signed Prescriptions Disp Refills  . enalapril (VASOTEC) 5 MG tablet 30 tablet 6    Sig: TAKE 1 TABLET BY MOUTH EVERY DAY    Authorizing Provider: Antonieta Iba    Ordering User: Kendrick Fries

## 2013-02-16 ENCOUNTER — Encounter: Payer: Self-pay | Admitting: Cardiovascular Disease

## 2013-02-16 ENCOUNTER — Ambulatory Visit (INDEPENDENT_AMBULATORY_CARE_PROVIDER_SITE_OTHER): Payer: 59 | Admitting: Cardiovascular Disease

## 2013-02-16 VITALS — BP 118/82 | HR 55 | Ht 74.0 in | Wt 260.0 lb

## 2013-02-16 DIAGNOSIS — E785 Hyperlipidemia, unspecified: Secondary | ICD-10-CM

## 2013-02-16 DIAGNOSIS — Z79899 Other long term (current) drug therapy: Secondary | ICD-10-CM

## 2013-02-16 DIAGNOSIS — I4891 Unspecified atrial fibrillation: Secondary | ICD-10-CM

## 2013-02-16 DIAGNOSIS — E119 Type 2 diabetes mellitus without complications: Secondary | ICD-10-CM

## 2013-02-16 NOTE — Patient Instructions (Signed)
You are doing well. No medication changes were made.  We will obtain your labs when they become available  Please call us if you have new issues that need to be addressed before your next appt.  Your physician wants you to follow-up in: 12 months.  You will receive a reminder letter in the mail two months in advance. If you don't receive a letter, please call our office to schedule the follow-up appointment.

## 2013-02-16 NOTE — Assessment & Plan Note (Signed)
We'll try to obtain his most recent lipid panel. Significant improvement on low-dose Lipitor

## 2013-02-16 NOTE — Assessment & Plan Note (Signed)
We have encouraged continued exercise, careful diet management in an effort to lose weight. He reports hemoglobin A1c is significantly improved

## 2013-02-16 NOTE — Assessment & Plan Note (Signed)
No arrhythmia over the course of the past year per the patient. No medication changes made

## 2013-02-16 NOTE — Progress Notes (Signed)
Patient ID: David CousinsMark P Dorsey, male    DOB: 08/25/1960, 53 y.o.   MRN: [id number]  HPI Comments: David Dorsey is a very pleasant 53 year old gentleman with a history of diabetes, h/o atrial fibrillation, status post cardioversion, weaned off amiodarone with recurrence of his atrial fibrillation, [place] [facility] that showed predominantly fixed defect in the inferior wall with mild reversibility, recurrence of his atrial fibrillation with repeat cardioversion at [facility]. Currently on low-dose amiodarone.  In followup today, he reports that he is doing well. No significant arrhythmia over the course of the past year. no chest pain, no shortness of breath, no palpitations.Overall has no complaints. He is working hard at the [employer] in [place], continued work stress  EKG today shows sinus bradycardia with rate 55 beats per minute, no significant ST or T wave changes Total cholesterol 185 on low-dose Lipitor 2014  Outpatient Encounter Prescriptions as of 02/16/2013  Medication Sig  . amiodarone (PACERONE) 200 MG tablet Take 1 tablet (200 mg total) by mouth daily.  Marland Kitchen. aspirin 81 MG tablet Take 81 mg by mouth daily.   Marland Kitchen. atorvastatin (LIPITOR) 10 MG tablet Take 10 mg by mouth daily.  . carvedilol (COREG) 12.5 MG tablet TAKE 1 TABLET BY MOUTH TWICE A DAY  . enalapril (VASOTEC) 5 MG tablet TAKE 1 TABLET BY MOUTH ONCE A DAY  . meloxicam (MOBIC) 15 MG tablet Take 15 mg by mouth daily.  . metFORMIN (GLUMETZA) 500 MG (MOD) 24 hr tablet Take 500 mg by mouth 2 (two) times daily with a meal.    . [DISCONTINUED] amiodarone (PACERONE) 200 MG tablet TAKE 1 TABLET BY MOUTH TWICE A DAY AS NEEDED  . [DISCONTINUED] atorvastatin (LIPITOR) 10 MG tablet TAKE 1 TABLET BY MOUTH ONCE A DAY  . [DISCONTINUED] enalapril (VASOTEC) 5 MG tablet TAKE 1 TABLET BY MOUTH EVERY DAY   Review of Systems  Constitutional: Negative.   HENT: Negative.   Eyes: Negative.   Respiratory: Negative.   Cardiovascular:  Negative.   Gastrointestinal: Negative.   Endocrine: Negative.   Musculoskeletal: Negative.   Skin: Negative.   Allergic/Immunologic: Negative.   Neurological: Negative.   Hematological: Negative.   Psychiatric/Behavioral: Negative.   All other systems reviewed and are negative.    BP 118/82  Pulse 55  Ht 6\' 2"  (1.88 m)  Wt 260 lb (117.935 kg)  BMI 33.37 kg/m2  Physical Exam  Nursing note and vitals reviewed. Constitutional: He is oriented to person, place, and time. He appears well-developed and well-nourished.  HENT:  Head: Normocephalic.  Nose: Nose normal.  Mouth/Throat: Oropharynx is clear and moist.  Eyes: Conjunctivae are normal. Pupils are equal, round, and reactive to light.  Neck: Normal range of motion. Neck supple. No JVD present.  Cardiovascular: Normal rate, S1 normal, S2 normal, normal heart sounds and intact distal pulses.  An irregularly irregular rhythm present. Exam reveals no gallop and no friction rub.   No murmur heard. Pulmonary/Chest: Effort normal and breath sounds normal. No respiratory distress. He has no wheezes. He has no rales. He exhibits no tenderness.  Abdominal: Soft. Bowel sounds are normal. He exhibits no distension. There is no tenderness.  Musculoskeletal: Normal range of motion. He exhibits no edema and no tenderness.  Lymphadenopathy:    He has no cervical adenopathy.  Neurological: He is alert and oriented to person, place, and time. Coordination normal.  Skin: Skin is warm and dry. No rash noted. No erythema.  Psychiatric: He has a normal  mood and affect. His behavior is normal. Judgment and thought content normal.      Assessment and Plan

## 2013-02-21 ENCOUNTER — Other Ambulatory Visit: Payer: Self-pay | Admitting: Cardiovascular Disease

## 2013-03-30 ENCOUNTER — Other Ambulatory Visit: Payer: Self-pay | Admitting: Cardiovascular Disease

## 2013-07-01 ENCOUNTER — Other Ambulatory Visit: Payer: Self-pay | Admitting: Cardiovascular Disease

## 2013-08-08 ENCOUNTER — Other Ambulatory Visit: Payer: Self-pay | Admitting: Cardiovascular Disease

## 2014-01-30 ENCOUNTER — Other Ambulatory Visit: Payer: Self-pay | Admitting: Cardiovascular Disease

## 2014-02-22 ENCOUNTER — Other Ambulatory Visit: Payer: Self-pay | Admitting: Cardiovascular Disease

## 2014-03-21 ENCOUNTER — Telehealth: Payer: Self-pay | Admitting: *Deleted

## 2014-03-21 ENCOUNTER — Other Ambulatory Visit: Payer: Self-pay | Admitting: Cardiovascular Disease

## 2014-03-21 NOTE — Telephone Encounter (Signed)
°  1. Which medications need to be refilled? Carvedilol and Atorvastatin  2. Which pharmacy is medication to be sent to? [facility] [place]   3. Do they need a 30 day or 90 day supply? 90 day but not sure  4. Would they like a call back once the medication has been sent to the pharmacy? Yes for pt is also calling it needs to be prior authorized as well.

## 2014-03-21 NOTE — Telephone Encounter (Signed)
lmov for pt, stating that he is due for an appointment and that we can only send in refills until that day.  But we need to schedule the apt then we will send another refill request in

## 2014-03-21 NOTE — Telephone Encounter (Signed)
Pt overdue for 1 yr f/u needs to schedule future appointment with Dr. Mariah MillingGollan, Thank you.

## 2014-03-22 ENCOUNTER — Ambulatory Visit: Payer: Self-pay | Admitting: Cardiovascular Disease

## 2014-03-22 MED ORDER — CARVEDILOL 12.5 MG PO TABS: 12.5000 mg | ORAL_TABLET | Freq: Two times a day (BID) | ORAL | Status: DC

## 2014-03-22 MED ORDER — ATORVASTATIN CALCIUM 10 MG PO TABS: 10.0000 mg | ORAL_TABLET | Freq: Every day | ORAL | Status: DC

## 2014-03-22 NOTE — Telephone Encounter (Signed)
Refill sent for carvedilol and atorvastatin for 90 day supply.

## 2014-03-22 NOTE — Telephone Encounter (Signed)
1. Which medications need to be refilled? Carvedilol and Atorvastatin  2. Which pharmacy is medication to be sent to[facility] [place]   3. Do they need a 30 day or 90 day supply? 90 day but not sure  4. Would they like a call back once the medication has been sent to the pharmacy? Yes for pt is also calling it needs to be prior authorized as well.   He is coming on the April 6th. Just need enough until then.

## 2014-04-11 ENCOUNTER — Encounter: Payer: Self-pay | Admitting: Cardiovascular Disease

## 2014-04-11 ENCOUNTER — Ambulatory Visit (INDEPENDENT_AMBULATORY_CARE_PROVIDER_SITE_OTHER): Payer: 59 | Admitting: Cardiovascular Disease

## 2014-04-11 VITALS — BP 120/88 | HR 69 | Ht 74.0 in | Wt 266.0 lb

## 2014-04-11 DIAGNOSIS — E119 Type 2 diabetes mellitus without complications: Secondary | ICD-10-CM

## 2014-04-11 DIAGNOSIS — E785 Hyperlipidemia, unspecified: Secondary | ICD-10-CM

## 2014-04-11 DIAGNOSIS — G4733 Obstructive sleep apnea (adult) (pediatric): Secondary | ICD-10-CM | POA: Diagnosis not present

## 2014-04-11 DIAGNOSIS — I4891 Unspecified atrial fibrillation: Secondary | ICD-10-CM | POA: Diagnosis not present

## 2014-04-11 NOTE — Assessment & Plan Note (Signed)
Encouraged him to stay on his Lipitor. Also encouraged weight loss, starting a exercise program. He reports that because of his busy work schedule, he is unable to exercise

## 2014-04-11 NOTE — Assessment & Plan Note (Signed)
We'll start anticoagulation for 3-4 weeks prior to chemical or DCCV.  He is unclear why his amiodarone has, off the list. Could retry this in 3-4 weeks time in an effort to cardiovert. If unsuccessful would perform DCCV. Etiology of his recurrent arrhythmia possibly from sleep apnea, weight gain

## 2014-04-11 NOTE — Progress Notes (Signed)
Patient ID: David Dorsey, male    DOB: 05-17-1960, 54 y.o.   MRN: [id number]  HPI Comments: [name] is a very pleasant 54 year old gentleman with a history of diabetes, h/o atrial fibrillation, status post cardioversion, weaned off amiodarone with recurrence of his atrial fibrillation, sent for stress test in [facility] that showed predominantly fixed defect in the inferior wall with mild reversibility, recurrence of his atrial fibrillation with repeat cardioversion at [facility]. Previously was on amiodarone on his last clinic visit in 2015. Today's visit, he reports he is no longer taking this is unclear how it fell off his list. He reports having a anxious feeling in his chest over the past 2-3 weeks.  EKG confirms atrial fibrillation with ventricular rate 69 bpm on today's visit. No significant ST or T-wave changes. Left axis deviation. Denies having palpitations, more of a "anxiety". He knew when he went into atrial fibrillation as he did not have this feeling before 2-3 weeks ago. Denies any precipitating factors, no recent illness or surgery. He is unsure if he has snoring episodes or apnea. This has been discussed in the past. Denies having leg edema, no shortness of breath with exertion. Otherwise has been sleeping relatively well. Weight has been trending up, unable to exercise. Continues to work in [employer]  Lab work reviewed with him showing hemoglobin A1c 7.6. Managed by primary care is from primary care indicate heart rate 58 on 03/06/2014  Other past medical history  He is working hard at the [employer] in [place], continued work stress  Total cholesterol 185 on low-dose Lipitor 2014  No Known Allergies  Outpatient Encounter Prescriptions as of 04/11/2014  Medication Sig  . aspirin 81 MG tablet Take 81 mg by mouth daily.   Marland Kitchen atorvastatin (LIPITOR) 10 MG tablet TAKE 1 TABLET BY MOUTH ONCE A DAY  . carvedilol (COREG) 12.5 MG tablet Take 1 tablet (12.5 mg  total) by mouth 2 (two) times daily.  . enalapril (VASOTEC) 5 MG tablet TAKE 1 TABLET BY MOUTH ONCE A DAY  . metFORMIN (GLUMETZA) 500 MG (MOD) 24 hr tablet Take 500 mg by mouth 2 (two) times daily with a meal.    . [DISCONTINUED] amiodarone (PACERONE) 200 MG tablet Take 1 tablet (200 mg total) by mouth daily.  . [DISCONTINUED] amiodarone (PACERONE) 200 MG tablet TAKE 1 TABLET BY MOUTH TWICE A DAY AS NEEDED  . [DISCONTINUED] atorvastatin (LIPITOR) 10 MG tablet Take 1 tablet (10 mg total) by mouth daily. (Patient not taking: Reported on 04/11/2014)  . [DISCONTINUED] enalapril (VASOTEC) 5 MG tablet TAKE 1 TABLET BY MOUTH ONCE A DAY (Patient not taking: Reported on 04/11/2014)  . [DISCONTINUED] meloxicam (MOBIC) 15 MG tablet Take 15 mg by mouth daily.    Past Medical History  Diagnosis Date  . Diabetes mellitus   . Type II or unspecified type diabetes mellitus without mention of complication, not stated as uncontrolled   . Personal history of colonic polyps   . Calculus of ureter   . Other chronic nonalcoholic liver disease   . Hyperlipidemia   . Atrial fibrillation   . CHF (congestive heart failure)     Past Surgical History  Procedure Laterality Date  . Rotator cuff repair  May 23, 2010    right  . Colonoscopy  08/29/2010  . Cardioversion  09/2010    Social History  reports that he quit smoking about 15 years ago. His smoking use included Cigarettes. He has a 15 pack-year smoking  history. He does not have any smokeless tobacco history on file. He reports that he drinks about 0.5 oz of alcohol per week. He reports that he does not use illicit drugs.  Family History Family history is unknown by patient.   Review of Systems  Constitutional: Negative.   Eyes: Negative.   Respiratory: Negative.   Cardiovascular: Negative.        "Anxious" feeling in his chest  Gastrointestinal: Negative.   Endocrine: Negative.   Musculoskeletal: Negative.   Skin: Negative.   Neurological: Negative.    Hematological: Negative.   Psychiatric/Behavioral: Negative.   All other systems reviewed and are negative.   BP 120/88 mmHg  Pulse 69  Ht 6\' 2"  (1.88 m)  Wt 266 lb (120.657 kg)  BMI 34.14 kg/m2  Physical Exam  Constitutional: He is oriented to person, place, and time. He appears well-developed and well-nourished.  HENT:  Head: Normocephalic.  Nose: Nose normal.  Mouth/Throat: Oropharynx is clear and moist.  Eyes: Conjunctivae are normal. Pupils are equal, round, and reactive to light.  Neck: Normal range of motion. Neck supple. No JVD present.  Cardiovascular: Normal rate, S1 normal, S2 normal, normal heart sounds and intact distal pulses.  An irregularly irregular rhythm present. Exam reveals no gallop and no friction rub.   No murmur heard. Pulmonary/Chest: Effort normal and breath sounds normal. No respiratory distress. He has no wheezes. He has no rales. He exhibits no tenderness.  Abdominal: Soft. Bowel sounds are normal. He exhibits no distension. There is no tenderness.  Musculoskeletal: Normal range of motion. He exhibits no edema or tenderness.  Lymphadenopathy:    He has no cervical adenopathy.  Neurological: He is alert and oriented to person, place, and time. Coordination normal.  Skin: Skin is warm and dry. No rash noted. No erythema.  Psychiatric: He has a normal mood and affect. His behavior is normal. Judgment and thought content normal.      Assessment and Plan   Nursing note and vitals reviewed.

## 2014-04-11 NOTE — Assessment & Plan Note (Signed)
Symptoms discussed with him. Suggested he consider sleep study, weight loss

## 2014-04-11 NOTE — Patient Instructions (Signed)
You are in atrial fibrillation, Please hold the aspirin  Start savaysa one a day (blood thinner) Stay on your other medications  EKG in 1 month If still in atrial fibrillation, we will start a medication in an attempt to convert the rhythm.  Please call us if you have new issues that need to be addressed before your next appt.  Your physician wants you to follow-up in: 1 month.

## 2014-04-11 NOTE — Assessment & Plan Note (Signed)
We have encouraged continued exercise, careful diet management in an effort to lose weight. 

## 2014-04-13 ENCOUNTER — Ambulatory Visit
Admit: 2014-04-13 | Disposition: A | Discharge status: Home / Self Care | Payer: Self-pay | Attending: Family Medicine | Admitting: Family Medicine

## 2014-05-01 ENCOUNTER — Encounter: Payer: Self-pay | Admitting: Cardiovascular Disease

## 2014-05-01 ENCOUNTER — Ambulatory Visit (INDEPENDENT_AMBULATORY_CARE_PROVIDER_SITE_OTHER): Payer: 59 | Admitting: Cardiovascular Disease

## 2014-05-01 VITALS — BP 120/60 | HR 103 | Ht 74.0 in | Wt 259.5 lb

## 2014-05-01 DIAGNOSIS — E785 Hyperlipidemia, unspecified: Secondary | ICD-10-CM | POA: Diagnosis not present

## 2014-05-01 DIAGNOSIS — I4891 Unspecified atrial fibrillation: Secondary | ICD-10-CM

## 2014-05-01 DIAGNOSIS — G4733 Obstructive sleep apnea (adult) (pediatric): Secondary | ICD-10-CM | POA: Diagnosis not present

## 2014-05-01 DIAGNOSIS — I420 Dilated cardiomyopathy: Secondary | ICD-10-CM

## 2014-05-01 DIAGNOSIS — E119 Type 2 diabetes mellitus without complications: Secondary | ICD-10-CM | POA: Diagnosis not present

## 2014-05-01 MED ORDER — ATORVASTATIN CALCIUM 10 MG PO TABS: 10.0000 mg | ORAL_TABLET | Freq: Every day | ORAL | Status: DC

## 2014-05-01 MED ORDER — AMIODARONE HCL 200 MG PO TABS: 200.0000 mg | ORAL_TABLET | Freq: Two times a day (BID) | ORAL | Status: DC

## 2014-05-01 MED ORDER — EDOXABAN TOSYLATE 60 MG PO TABS: 60.0000 mg | ORAL_TABLET | Freq: Every day | ORAL | Status: DC

## 2014-05-01 MED ORDER — ENALAPRIL MALEATE 5 MG PO TABS: 5.0000 mg | ORAL_TABLET | Freq: Every day | ORAL | Status: DC

## 2014-05-01 MED ORDER — CARVEDILOL 12.5 MG PO TABS: 12.5000 mg | ORAL_TABLET | Freq: Two times a day (BID) | ORAL | Status: DC

## 2014-05-01 NOTE — Assessment & Plan Note (Signed)
Recommended he stay on Lipitor 10 mg daily

## 2014-05-01 NOTE — Progress Notes (Signed)
Patient ID: David Dorsey, male    DOB: 06-14-1960, 54 y.o.   MRN: [id number]  HPI Comments: Mr. Baby is a very pleasant 3 year old gentleman with a history of diabetes, h/o atrial fibrillation, status post cardioversion, weaned off amiodarone with recurrence of his atrial fibrillation, sent for stress test in [facility] that showed predominantly fixed defect in the inferior wall with mild reversibility, recurrence of his atrial fibrillation with repeat cardioversion at [facility]. Previously was on amiodarone on his last clinic visit in 2015. Today's visit, he reports he is no longer taking this is unclear how it fell off his list. He reports having a anxious feeling in his chest over the past 2-3 weeks.  On his last clinic visit, he was in atrial fibrillation. We started him on anticoagulation, Savaysa 60 mg daily. He presents now 3 weeks later. EKG again confirms atrial fibrillation. He is having some palpitations, mild shortness of breath with exertion. He is scheduled to leave/moved to [facility] in the near future and would like to have cardioversion prior to leaving the state.  EKG on today's visit shows atrial fibrillation with ventricular rate ranging from 103 up to 113 bpm, left axis deviation  Other past medical history Possible sleep apnea, refused sleep study in the past Previous lab work showing hemoglobin A1c 7.6.  He is working hard at the [employer] in [place], continued work stress Total cholesterol 185 on low-dose Lipitor 2014  No Known Allergies  Outpatient Encounter Prescriptions as of 05/01/2014  Medication Sig  . atorvastatin (LIPITOR) 10 MG tablet Take 1 tablet (10 mg total) by mouth daily.  . carvedilol (COREG) 12.5 MG tablet Take 1 tablet (12.5 mg total) by mouth 2 (two) times daily.  . enalapril (VASOTEC) 5 MG tablet Take 1 tablet (5 mg total) by mouth daily.  . metFORMIN (GLUMETZA) 500 MG (MOD) 24 hr tablet Take 500 mg by mouth 2 (two) times daily with a meal.     . [DISCONTINUED] aspirin 81 MG tablet Take 81 mg by mouth daily.   . [DISCONTINUED] atorvastatin (LIPITOR) 10 MG tablet TAKE 1 TABLET BY MOUTH ONCE A DAY  . [DISCONTINUED] carvedilol (COREG) 12.5 MG tablet Take 1 tablet (12.5 mg total) by mouth 2 (two) times daily.  . [DISCONTINUED] enalapril (VASOTEC) 5 MG tablet TAKE 1 TABLET BY MOUTH ONCE A DAY  . amiodarone (PACERONE) 200 MG tablet Take 1 tablet (200 mg total) by mouth 2 (two) times daily.  Marland Kitchen edoxaban (SAVAYSA) 60 MG TABS tablet Take 60 mg by mouth daily.    Past Medical History  Diagnosis Date  . Diabetes mellitus   . Type II or unspecified type diabetes mellitus without mention of complication, not stated as uncontrolled   . Personal history of colonic polyps   . Calculus of ureter   . Other chronic nonalcoholic liver disease   . Hyperlipidemia   . Atrial fibrillation   . CHF (congestive heart failure)     Past Surgical History  Procedure Laterality Date  . Rotator cuff repair  May 23, 2010    right  . Colonoscopy  08/29/2010  . Cardioversion  09/2010    Social History  reports that he quit smoking about 15 years ago. His smoking use included Cigarettes. He has a 15 pack-year smoking history. He does not have any smokeless tobacco history on file. He reports that he drinks about 0.5 oz of alcohol per week. He reports that he does not use illicit drugs.  Family  History Family history is unknown by patient.  Review of Systems  Constitutional: Negative.   Respiratory: Positive for shortness of breath.   Cardiovascular: Positive for palpitations.       "Anxious" feeling in his chest  Gastrointestinal: Negative.   Musculoskeletal: Negative.   Skin: Negative.   Neurological: Negative.   Hematological: Negative.   Psychiatric/Behavioral: Negative.   All other systems reviewed and are negative.   BP 120/60 mmHg  Pulse 103  Ht 6\' 2"  (1.88 m)  Wt 259 lb 8 oz (117.708 kg)  BMI 33.30 kg/m2  Physical Exam   Constitutional: He is oriented to person, place, and time. He appears well-developed and well-nourished.  HENT:  Head: Normocephalic.  Nose: Nose normal.  Mouth/Throat: Oropharynx is clear and moist.  Eyes: Conjunctivae are normal. Pupils are equal, round, and reactive to light.  Neck: Normal range of motion. Neck supple. No JVD present.  Cardiovascular: Normal rate, S1 normal, S2 normal, normal heart sounds and intact distal pulses.  An irregularly irregular rhythm present. Exam reveals no gallop and no friction rub.   No murmur heard. Pulmonary/Chest: Effort normal and breath sounds normal. No respiratory distress. He has no wheezes. He has no rales. He exhibits no tenderness.  Abdominal: Soft. Bowel sounds are normal. He exhibits no distension. There is no tenderness.  Musculoskeletal: Normal range of motion. He exhibits no edema or tenderness.  Lymphadenopathy:    He has no cervical adenopathy.  Neurological: He is alert and oriented to person, place, and time. Coordination normal.  Skin: Skin is warm and dry. No rash noted. No erythema.  Psychiatric: He has a normal mood and affect. His behavior is normal. Judgment and thought content normal.      Assessment and Plan   Nursing note and vitals reviewed.

## 2014-05-01 NOTE — Patient Instructions (Addendum)
Please start amiodarone 2 pills twice a day (400 mg twice a day) After 5 days decrease the dose down to 200 mg twice a day  Stay on Savaysa one a day  Please call us if you have new issues that need to be addressed before your next appt.  Your physician wants you to follow-up in: EKG & possible labs on May 4th Schedule cardioversion on May 19th

## 2014-05-01 NOTE — Assessment & Plan Note (Signed)
Symptoms discussed with him. Suggested he consider sleep study, weight loss

## 2014-05-01 NOTE — Assessment & Plan Note (Addendum)
Previous ejection fraction in 2012 by echo was 35-40%. He has a long history of poorly controlled diabetes mellitus.  Prior stress tests showing fixed defect in the inferior wall, possible mild reversibility. Otherwise has been asymptomatic Repeat echocardiogram 2013 showing normal ejection fraction, greater than 55%

## 2014-05-01 NOTE — Assessment & Plan Note (Signed)
We have encouraged continued exercise, careful diet management in an effort to lose weight. 

## 2014-05-01 NOTE — Assessment & Plan Note (Signed)
Continues to be in atrial fibrillation on today's visit. We will start him on amiodarone load 400 mg twice a day for 5 days then 200 mg twice a day Plan cardioversion on May 19. He is out of town until in.

## 2014-05-09 ENCOUNTER — Other Ambulatory Visit (INDEPENDENT_AMBULATORY_CARE_PROVIDER_SITE_OTHER): Payer: 59 | Admitting: *Deleted

## 2014-05-09 ENCOUNTER — Ambulatory Visit (INDEPENDENT_AMBULATORY_CARE_PROVIDER_SITE_OTHER): Payer: 59

## 2014-05-09 VITALS — Ht 74.0 in | Wt 260.0 lb

## 2014-05-09 DIAGNOSIS — I4891 Unspecified atrial fibrillation: Secondary | ICD-10-CM

## 2014-05-09 NOTE — Patient Instructions (Signed)
You are still in afib  Proceed w/ cardioversion as scheduled on 05/24/14

## 2014-05-09 NOTE — Progress Notes (Signed)
1.) Reason for visit: EKG  2.) Name of MD requesting visit: Dr. Mariah MillingGollan  3.) H&P: [facility], started on Savaysa at that time. [facility], pt is still in afib and requested cardioversion [place] [facility] [place].   4.) ROS related to problem:  Pt reports that he is still having some palpitations and mild SOBOE.  5.) Assessment and plan per MD: Pt is sched for DCCV 05/24/14 @ 7:30.  Pre-procedure labs were drawn today and instructions were reviewed w/ pt.

## 2014-05-10 LAB — CBC WITH DIFFERENTIAL/PLATELET
BASOS: 0 %
Basophils Absolute: 0 10*3/uL (ref 0.0–0.2)
EOS (ABSOLUTE): 0.2 10*3/uL (ref 0.0–0.4)
EOS: 2 %
HEMOGLOBIN: 13.9 g/dL (ref 12.6–17.7)
Hematocrit: 39.7 % (ref 37.5–51.0)
IMMATURE GRANS (ABS): 0 10*3/uL (ref 0.0–0.1)
Immature Granulocytes: 0 %
Lymphocytes Absolute: 2.7 10*3/uL (ref 0.7–3.1)
Lymphs: 35 %
MCH: 29.6 pg (ref 26.6–33.0)
MCHC: 35 g/dL (ref 31.5–35.7)
MCV: 85 fL (ref 79–97)
MONOS ABS: 0.6 10*3/uL (ref 0.1–0.9)
Monocytes: 8 %
NEUTROS ABS: 4.1 10*3/uL (ref 1.4–7.0)
Neutrophils: 55 %
Platelets: 178 10*3/uL (ref 150–379)
RBC: 4.7 x10E6/uL (ref 4.14–5.80)
RDW: 13.3 % (ref 12.3–15.4)
WBC: 7.5 10*3/uL (ref 3.4–10.8)

## 2014-05-10 LAB — BASIC METABOLIC PANEL
BUN / CREAT RATIO: 22 — AB (ref 9–20)
BUN: 21 mg/dL (ref 6–24)
CO2: 21 mmol/L (ref 18–29)
CREATININE: 0.94 mg/dL (ref 0.76–1.27)
Calcium: 9.2 mg/dL (ref 8.7–10.2)
Chloride: 100 mmol/L (ref 97–108)
GFR calc Af Amer: 106 mL/min/{1.73_m2} (ref >59)
GFR calc non Af Amer: 92 mL/min/{1.73_m2} (ref >59)
Glucose: 224 mg/dL — ABNORMAL HIGH (ref 65–99)
Potassium: 4.3 mmol/L (ref 3.5–5.2)
SODIUM: 140 mmol/L (ref 134–144)

## 2014-05-10 LAB — PROTIME-INR
INR: 1.1 (ref 0.8–1.2)
PROTHROMBIN TIME: 11.8 s (ref 9.1–12.0)

## 2014-05-11 ENCOUNTER — Ambulatory Visit: Payer: 59 | Admitting: Cardiovascular Disease

## 2014-05-23 ENCOUNTER — Other Ambulatory Visit: Payer: Self-pay | Admitting: Cardiovascular Disease

## 2014-05-23 DIAGNOSIS — I4819 Other persistent atrial fibrillation: Secondary | ICD-10-CM

## 2014-05-24 ENCOUNTER — Other Ambulatory Visit: Payer: Self-pay | Admitting: Cardiovascular Disease

## 2014-05-24 ENCOUNTER — Ambulatory Visit: Payer: 59 | Admitting: Registered Nurse

## 2014-05-24 ENCOUNTER — Encounter
Admission: RE | Disposition: A | Discharge status: Home / Self Care | Payer: Self-pay | Source: Ambulatory Visit | Attending: Cardiovascular Disease

## 2014-05-24 ENCOUNTER — Encounter: Payer: Self-pay | Admitting: *Deleted

## 2014-05-24 ENCOUNTER — Ambulatory Visit: Admit: 2014-05-24 | Payer: Self-pay | Admitting: Cardiovascular Disease

## 2014-05-24 ENCOUNTER — Ambulatory Visit
Admission: RE | Admit: 2014-05-24 | Discharge: 2014-05-24 | Disposition: A | Discharge status: Home / Self Care | Payer: 59 | Source: Ambulatory Visit | Attending: Cardiovascular Disease | Admitting: Cardiovascular Disease

## 2014-05-24 DIAGNOSIS — E785 Hyperlipidemia, unspecified: Secondary | ICD-10-CM | POA: Diagnosis not present

## 2014-05-24 DIAGNOSIS — Z87891 Personal history of nicotine dependence: Secondary | ICD-10-CM | POA: Diagnosis not present

## 2014-05-24 DIAGNOSIS — I481 Persistent atrial fibrillation: Secondary | ICD-10-CM | POA: Insufficient documentation

## 2014-05-24 DIAGNOSIS — Z79899 Other long term (current) drug therapy: Secondary | ICD-10-CM | POA: Diagnosis not present

## 2014-05-24 DIAGNOSIS — E1165 Type 2 diabetes mellitus with hyperglycemia: Secondary | ICD-10-CM | POA: Diagnosis not present

## 2014-05-24 DIAGNOSIS — I480 Paroxysmal atrial fibrillation: Secondary | ICD-10-CM | POA: Diagnosis not present

## 2014-05-24 DIAGNOSIS — Z7982 Long term (current) use of aspirin: Secondary | ICD-10-CM | POA: Insufficient documentation

## 2014-05-24 DIAGNOSIS — Z86010 Personal history of colonic polyps: Secondary | ICD-10-CM | POA: Insufficient documentation

## 2014-05-24 DIAGNOSIS — I509 Heart failure, unspecified: Secondary | ICD-10-CM | POA: Insufficient documentation

## 2014-05-24 DIAGNOSIS — I420 Dilated cardiomyopathy: Secondary | ICD-10-CM | POA: Insufficient documentation

## 2014-05-24 DIAGNOSIS — Z87442 Personal history of urinary calculi: Secondary | ICD-10-CM | POA: Diagnosis not present

## 2014-05-24 DIAGNOSIS — R0602 Shortness of breath: Secondary | ICD-10-CM | POA: Diagnosis not present

## 2014-05-24 DIAGNOSIS — I4819 Other persistent atrial fibrillation: Secondary | ICD-10-CM

## 2014-05-24 DIAGNOSIS — I4891 Unspecified atrial fibrillation: Secondary | ICD-10-CM | POA: Diagnosis present

## 2014-05-24 DIAGNOSIS — G4733 Obstructive sleep apnea (adult) (pediatric): Secondary | ICD-10-CM | POA: Diagnosis present

## 2014-05-24 HISTORY — PX: ELECTROPHYSIOLOGIC STUDY: SHX172A

## 2014-05-24 LAB — GLUCOSE, CAPILLARY: Glucose-Capillary: 248 mg/dL — ABNORMAL HIGH (ref 65–99)

## 2014-05-24 SURGERY — CARDIOVERSION (CATH LAB)
Anesthesia: General

## 2014-05-24 MED ORDER — SODIUM CHLORIDE 0.9 % IV SOLN
INTRAVENOUS | Status: DC
  Administered 2014-05-24: 07:00:00 via INTRAVENOUS

## 2014-05-24 MED ORDER — PROPOFOL 10 MG/ML IV BOLUS
INTRAVENOUS | Status: DC | PRN
  Administered 2014-05-24: 100 mg via INTRAVENOUS

## 2014-05-24 NOTE — CV Procedure (Addendum)
Cardioversion procedure note For paroxysmal atrial fibrillation.  Procedure Details:  Consent: Risks of procedure as well as the alternatives and risks of each were explained to the (patient/caregiver). Consent for procedure obtained.  Time Out: Verified patient identification, verified procedure, site/side was marked, verified correct patient position, special equipment/implants available, medications/allergies/relevent history reviewed, required imaging and test results available. Performed  Preprocedure EKG showing atrial fibrillation, rate 70s  Patient placed on cardiac monitor, pulse oximetry, supplemental oxygen as necessary.  Sedation given: propofol IV, Dr. Meta HatchetVanstaverin Pacer pads placed anterior and posterior chest.   Cardioverted 1 time(s).  Cardioverted at  150 J. Synchronized biphasic Converted to NSR   Evaluation: Findings: Post procedure EKG shows: NSR Complications: None Patient did tolerate procedure well.  Time Spent Directly with the Patient:  45 minutes   Dossie Arbourim Willian Donson, M.D., Ph.D. Signed 05/24/14  8:09 AM

## 2014-05-24 NOTE — OR Nursing (Signed)
8:15 see anesthesia record for the cardioversion documentation

## 2014-05-24 NOTE — OR Nursing (Signed)
0:803 time out called for electrical cardioversion for atrial fib  by Dr. Mariah MillingGollan.  Allergies identified.

## 2014-05-24 NOTE — Anesthesia Preprocedure Evaluation (Addendum)
Anesthesia Evaluation  Patient identified by MRN, date of birth, ID band Patient awake    Reviewed: Allergy & Precautions, H&P , NPO status , Patient's Chart, lab work & pertinent test results  History of Anesthesia Complications Negative for: history of anesthetic complications  Airway Mallampati: II  TM Distance: >3 FB Neck ROM: full    Dental no notable dental hx.    Pulmonary former smoker,  Quit 25 years ago breath sounds clear to auscultation  Pulmonary exam normal       Cardiovascular Exercise Tolerance: Good - CHF Rhythm:irregular  Patient denies CHF history   Neuro/Psych negative neurological ROS  negative psych ROS   GI/Hepatic negative GI ROS, Neg liver ROS,   Endo/Other  diabetes  Renal/GU negative Renal ROS  negative genitourinary   Musculoskeletal   Abdominal Normal abdominal exam  (+)   Peds  Hematology negative hematology ROS (+)   Anesthesia Other Findings   Reproductive/Obstetrics                           Anesthesia Physical Anesthesia Plan  ASA: III  Anesthesia Plan: General   Post-op Pain Management:    Induction: Intravenous  Airway Management Planned: Nasal Cannula  Additional Equipment:   Intra-op Plan:   Post-operative Plan:   Informed Consent: I have reviewed the patients History and Physical, chart, labs and discussed the procedure including the risks, benefits and alternatives for the proposed anesthesia with the patient or authorized representative who has indicated his/her understanding and acceptance.     Plan Discussed with: Anesthesiologist, CRNA and Surgeon  Anesthesia Plan Comments:        Anesthesia Quick Evaluation

## 2014-05-24 NOTE — Transfer of Care (Signed)
  Immediate Anesthesia Transfer of Care Note  Patient: David CousinsMark P Rouillard  Procedure(s) Performed: Procedure(s): Cardioversion (N/A)  Patient Location: PACU and Short Stay  Anesthesia Type:General  Level of Consciousness: awake, alert  and oriented  Airway & Oxygen Therapy: Patient Spontanous Breathing and Patient connected to nasal cannula oxygen  Post-op Assessment: Report given to RN and Post -op Vital signs reviewed and stable  Post vital signs: Reviewed and stable  Last Vitals:  Filed Vitals:   05/24/14 0800  BP: 110/77  Pulse:   Temp:   Resp: 14    Complications: No apparent anesthesia complications

## 2014-05-24 NOTE — Anesthesia Postprocedure Evaluation (Signed)
  Anesthesia Post-op Note  Patient: David CousinsMark P Bundren  Procedure(s) Performed: Procedure(s): Cardioversion (N/A)  Anesthesia type:General  Patient location: PACU  Post pain: Pain level controlled  Post assessment: Post-op Vital signs reviewed, Patient's Cardiovascular Status Stable, Respiratory Function Stable, Patent Airway and No signs of Nausea or vomiting  Post vital signs: Reviewed and stable  Last Vitals:  Filed Vitals:   05/24/14 0809  BP: 100/66  Pulse: 57  Temp:   Resp: 12    Level of consciousness: awake, alert  and patient cooperative  Complications: No apparent anesthesia complications

## 2014-05-24 NOTE — Anesthesia Procedure Notes (Signed)
Performed by: Stormy FabianURTIS, Klye Besecker Pre-anesthesia Checklist: Suction available, Emergency Drugs available, Patient identified, Patient being monitored and Timeout performed Oxygen Delivery Method: Nasal cannula Intubation Type: IV induction

## 2014-05-31 NOTE — H&P (Signed)
Patient ID: David Dorsey, male DOB: 03/02/1960, 54 y.o. MRN: [id number]  HPI Comments: Mr. David Dorsey is a very pleasant 54 year old gentleman with a history of diabetes, h/o atrial fibrillation, status post cardioversion, weaned off amiodarone with recurrence of his atrial fibrillation, [place] [facility] that showed predominantly fixed defect in the inferior wall with mild reversibility, recurrence of his atrial fibrillation with repeat cardioversion at [facility]. Previously was on amiodarone on his last clinic visit in 2015.   He presents today for cardioversion for paroxysmal atrial fibrillation, persistent in the past 2 months   On his last clinic visit, he was in atrial fibrillation. We started him on anticoagulation, Savaysa 60 mg daily.  EKG 3 weeks lateragain confirmed atrial fibrillation. He was having some palpitations, mild shortness of breath with exertion.he elected to proceed with cardioversion given his symptoms    Other past medical history Possible sleep apnea, refused sleep study in the past Previous lab work showing hemoglobin A1c 7.6.  He is working hard at the [employer] in [place], continued work stress Total cholesterol 185 on low-dose Lipitor 2014  No Known Allergies  Outpatient Encounter Prescriptions as of 05/24/2014  Medication Sig  . atorvastatin (LIPITOR) 10 MG tablet Take 1 tablet (10 mg total) by mouth daily.  . carvedilol (COREG) 12.5 MG tablet Take 1 tablet (12.5 mg total) by mouth 2 (two) times daily.  . enalapril (VASOTEC) 5 MG tablet Take 1 tablet (5 mg total) by mouth daily.  . metFORMIN (GLUMETZA) 500 MG (MOD) 24 hr tablet Take 500 mg by mouth 2 (two) times daily with a meal.   . [DISCONTINUED] aspirin 81 MG tablet Take 81 mg by mouth daily.   . [DISCONTINUED] atorvastatin (LIPITOR) 10 MG tablet TAKE 1 TABLET BY MOUTH ONCE A DAY  . [DISCONTINUED] carvedilol (COREG) 12.5 MG tablet Take 1 tablet (12.5 mg total)  by mouth 2 (two) times daily.  . [DISCONTINUED] enalapril (VASOTEC) 5 MG tablet TAKE 1 TABLET BY MOUTH ONCE A DAY  . amiodarone (PACERONE) 200 MG tablet Take 1 tablet (200 mg total) by mouth 2 (two) times daily.  Marland Kitchen. edoxaban (SAVAYSA) 60 MG TABS tablet Take 60 mg by mouth daily.    Past Medical History  Diagnosis Date  . Diabetes mellitus   . Type II or unspecified type diabetes mellitus without mention of complication, not stated as uncontrolled   . Personal history of colonic polyps   . Calculus of ureter   . Other chronic nonalcoholic liver disease   . Hyperlipidemia   . Atrial fibrillation   . CHF (congestive heart failure)     Past Surgical History  Procedure Laterality Date  . Rotator cuff repair  May 23, 2010    right  . Colonoscopy  08/29/2010  . Cardioversion  09/2010    Social History  reports that he quit smoking about 15 years ago. His smoking use included Cigarettes. He has a 15 pack-year smoking history. He does not have any smokeless tobacco history on file. He reports that he drinks about 0.5 oz of alcohol per week. He reports that he does not use illicit drugs.  Family History Family history is unknown by patient.  Review of Systems  Constitutional: Negative.  Respiratory: Positive for shortness of breath.  Cardiovascular: Positive for palpitations.   "Anxious" feeling in his chest  Gastrointestinal: Negative.  Musculoskeletal: Negative.  Skin: Negative.  Neurological: Negative.  Hematological: Negative.  Psychiatric/Behavioral: Negative.  All other systems reviewed and are  negative.   BP 120/60 mmHg  Pulse 103  Ht  (1.88 m)  Wt 259 lb 8 oz (117.708 kg)  BMI 33.30 kg/m2  Physical Exam  Constitutional: He is oriented to person, place, and time. He appears well-developed and well-nourished.  HENT:  Head: Normocephalic.  Nose: Nose normal.  Mouth/Throat: Oropharynx is  clear and moist.  Eyes: Conjunctivae are normal. Pupils are equal, round, and reactive to light.  Neck: Normal range of motion. Neck supple. No JVD present.  Cardiovascular: Normal rate, S1 normal, S2 normal, normal heart sounds and intact distal pulses. An irregularly irregular rhythm present. Exam reveals no gallop and no friction rub.  No murmur heard. Pulmonary/Chest: Effort normal and breath sounds normal. No respiratory distress. He has no wheezes. He has no rales. He exhibits no tenderness.  Abdominal: Soft. Bowel sounds are normal. He exhibits no distension. There is no tenderness.  Musculoskeletal: Normal range of motion. He exhibits no edema or tenderness.  Lymphadenopathy:   He has no cervical adenopathy.  Neurological: He is alert and oriented to person, place, and time. Coordination normal.  Skin: Skin is warm and dry. No rash noted. No erythema.  Psychiatric: He has a normal mood and affect. His behavior is normal. Judgment and thought content normal.      Assessment and Plan Nursing note and vitals reviewed.                 Atrial fibrillation -     Status: Written Related Problem: Atrial fibrillation   Expand All Collapse All   Cardioversion scheduled for today for continued symptoms of palpitations, shortness of breath            OSA (obstructive sleep apnea) -     Status: Written Related Problem: OSA (obstructive sleep apnea)   Expand All Collapse All   Symptoms discussed with him. Suggested he consider sleep study, weight loss              Hyperlipidemia -     Status: Written Related Problem: Hyperlipidemia   Expand All Collapse All   Recommended he stay on Lipitor 10 mg daily            Diabetes mellitus -    Status: Written Related Problem: Diabetes mellitus   Expand All Collapse All   We have encouraged continued exercise, careful diet management in an effort to lose weight.             Congestive dilated cardiomyopathy    Status: Edited Related Problem: Congestive dilated cardiomyopathy   Expand All Collapse All   Previous ejection fraction in 2012 by echo was 35-40%. He has a long history of poorly controlled diabetes mellitus.  Prior stress tests showing fixed defect in the inferior wall, possible mild reversibility. Otherwise has been asymptomatic Repeat echocardiogram 2013 showing normal ejection fraction, greater than 55%

## 2014-06-13 ENCOUNTER — Other Ambulatory Visit: Payer: Self-pay

## 2014-06-13 ENCOUNTER — Other Ambulatory Visit: Payer: Self-pay | Admitting: Family Medicine

## 2014-06-13 MED ORDER — CARVEDILOL 12.5 MG PO TABS: 12.5000 mg | ORAL_TABLET | Freq: Two times a day (BID) | ORAL | Status: DC

## 2014-06-13 MED ORDER — ATORVASTATIN CALCIUM 10 MG PO TABS: 10.0000 mg | ORAL_TABLET | Freq: Every day | ORAL | Status: DC

## 2014-06-13 MED ORDER — EDOXABAN TOSYLATE 60 MG PO TABS: 60.0000 mg | ORAL_TABLET | Freq: Every day | ORAL | Status: DC

## 2014-06-13 MED ORDER — ENALAPRIL MALEATE 5 MG PO TABS: 5.0000 mg | ORAL_TABLET | Freq: Every day | ORAL | Status: DC

## 2014-06-13 MED ORDER — AMIODARONE HCL 200 MG PO TABS: 200.0000 mg | ORAL_TABLET | Freq: Two times a day (BID) | ORAL | Status: DC

## 2014-06-28 ENCOUNTER — Encounter
Admission: RE | Admit: 2014-06-28 | Discharge: 2014-06-28 | Disposition: A | Discharge status: Home / Self Care | Payer: 59 | Source: Ambulatory Visit | Attending: Orthopedic Surgery | Admitting: Orthopedic Surgery

## 2014-06-28 DIAGNOSIS — Z01812 Encounter for preprocedural laboratory examination: Secondary | ICD-10-CM | POA: Insufficient documentation

## 2014-06-28 DIAGNOSIS — E785 Hyperlipidemia, unspecified: Secondary | ICD-10-CM | POA: Diagnosis not present

## 2014-06-28 DIAGNOSIS — M25812 Other specified joint disorders, left shoulder: Secondary | ICD-10-CM | POA: Insufficient documentation

## 2014-06-28 DIAGNOSIS — E119 Type 2 diabetes mellitus without complications: Secondary | ICD-10-CM | POA: Insufficient documentation

## 2014-06-28 DIAGNOSIS — I4891 Unspecified atrial fibrillation: Secondary | ICD-10-CM | POA: Insufficient documentation

## 2014-06-28 HISTORY — DX: Calculus of kidney: N20.0

## 2014-06-28 HISTORY — DX: Unspecified osteoarthritis, unspecified site: M19.90

## 2014-06-28 LAB — URINALYSIS COMPLETE WITH MICROSCOPIC (ARMC ONLY)
BACTERIA UA: NONE SEEN
BILIRUBIN URINE: NEGATIVE
Glucose, UA: >500 mg/dL — AB
HGB URINE DIPSTICK: NEGATIVE
LEUKOCYTES UA: NEGATIVE
Nitrite: NEGATIVE
Protein, ur: NEGATIVE mg/dL
Specific Gravity, Urine: 1.022 (ref 1.005–1.030)
pH: 6 (ref 5.0–8.0)

## 2014-06-28 LAB — CBC
HEMATOCRIT: 41.9 % (ref 40.0–52.0)
HEMOGLOBIN: 14.1 g/dL (ref 13.0–18.0)
MCH: 29.4 pg (ref 26.0–34.0)
MCHC: 33.7 g/dL (ref 32.0–36.0)
MCV: 87.4 fL (ref 80.0–100.0)
Platelets: 193 10*3/uL (ref 150–440)
RBC: 4.79 MIL/uL (ref 4.40–5.90)
RDW: 13.8 % (ref 11.5–14.5)
WBC: 7.9 10*3/uL (ref 3.8–10.6)

## 2014-06-28 LAB — APTT: APTT: 29 s (ref 24–36)

## 2014-06-28 LAB — BASIC METABOLIC PANEL
Anion gap: 10 (ref 5–15)
BUN: 19 mg/dL (ref 6–20)
CALCIUM: 9.5 mg/dL (ref 8.9–10.3)
CHLORIDE: 104 mmol/L (ref 101–111)
CO2: 28 mmol/L (ref 22–32)
CREATININE: 0.77 mg/dL (ref 0.61–1.24)
GFR calc Af Amer: >60 mL/min (ref >60)
GFR calc non Af Amer: >60 mL/min (ref >60)
Glucose, Bld: 194 mg/dL — ABNORMAL HIGH (ref 65–99)
Potassium: 4 mmol/L (ref 3.5–5.1)
Sodium: 142 mmol/L (ref 135–145)

## 2014-06-28 LAB — PROTIME-INR
INR: 1.18
PROTHROMBIN TIME: 15.2 s — AB (ref 11.4–15.0)

## 2014-06-28 NOTE — Patient Instructions (Signed)
  Your procedure is scheduled on: July 18, 2014 (Wednesday) Report to Day Surgery. To find out your arrival time please call [phone] between 1PM - 3PM on July 17, 2014 (Tuesday) Remember: Instructions that are not followed completely may result in serious medical risk, up to and including death, or upon the discretion of your surgeon and anesthesiologist your surgery may need to be rescheduled.    __x__ 1. Do not eat food or drink liquids after midnight. No gum chewing or hard candies.     __x__ 2. No Alcohol for 24 hours before or after surgery.   ____ 3. Bring all medications with you on the day of surgery if instructed.    __x__ 4. Notify your doctor if there is any change in your medical condition     (cold, fever, infections).     Do not wear jewelry, make-up, hairpins, clips or nail polish.  Do not wear lotions, powders, or perfumes. You may wear deodorant.  Do not shave 48 hours prior to surgery. Men may shave face and neck.  Do not bring valuables to the hospital.    [facility] is not responsible for any belongings or valuables.               Contacts, dentures or bridgework may not be worn into surgery.  Leave your suitcase in the car. After surgery it may be brought to your room.  For patients admitted to the hospital, discharge time is determined by your                treatment team.   Patients discharged the day of surgery will not be allowed to drive home.   Please read over the following fact sheets that you were given:   Surgical Site Infection Prevention   _x___ Take these medicines the morning of surgery with A SIP OF WATER:    1.Carvedilol  2. Atorvastatin  3. Amiodarone  4.Enalapril   ____ Fleet Enema (as directed)   _x__ Use CHG Soap as directed  ____ Use inhalers on the day of surgery  __x__ Stop metformin 2 days prior to surgery (STOP METFORMIN ON July 11,2016)   ____ Take 1/2 of usual insulin dose the night before surgery and none on the  morning of surgery.   ____ Stop Coumadin/Plavix/aspirin on (Dr. Martha Clan office to notify patient about stopping Savaysa)  ____ Stop Anti-inflammatories on  ____ Stop supplements until after surgery.    ____ Bring C-Pap to the hospital.

## 2014-06-29 NOTE — OR Nursing (Signed)
ua faxed to surgeon

## 2014-07-03 ENCOUNTER — Telehealth: Payer: Self-pay | Admitting: Cardiovascular Disease

## 2014-07-03 NOTE — Telephone Encounter (Signed)
Hold Savaysa 2 days prior to surgery. Restart 24 hours post surgery or when ok per MD. There is an increased risk of stroke when coming off anticoagulation.

## 2014-07-03 NOTE — Telephone Encounter (Signed)
Rotator cuff surgery on July 13th.  When to stop blood thinner.

## 2014-07-03 NOTE — Telephone Encounter (Signed)
Left message for pt to call back  °

## 2014-07-06 NOTE — Telephone Encounter (Signed)
Attempted to contact pt.  Home # has been disconnected. Left message on mobile vm to call back.

## 2014-07-06 NOTE — Telephone Encounter (Signed)
Please call patient he did not receive your vm.

## 2014-07-10 ENCOUNTER — Other Ambulatory Visit: Payer: 59

## 2014-07-17 MED ORDER — CEFAZOLIN SODIUM-DEXTROSE 2-3 GM-% IV SOLR
2.0000 g | Freq: Once | INTRAVENOUS | Status: AC
  Administered 2014-07-18: 2 g via INTRAVENOUS

## 2014-07-18 ENCOUNTER — Encounter
Admission: RE | Disposition: A | Discharge status: Home / Self Care | Payer: Self-pay | Source: Ambulatory Visit | Attending: Orthopedic Surgery

## 2014-07-18 ENCOUNTER — Ambulatory Visit: Payer: Worker's Compensation | Admitting: Anesthesiology

## 2014-07-18 ENCOUNTER — Ambulatory Visit
Admission: RE | Admit: 2014-07-18 | Discharge: 2014-07-18 | Disposition: A | Discharge status: Home / Self Care | Payer: Worker's Compensation | Source: Ambulatory Visit | Attending: Orthopedic Surgery | Admitting: Orthopedic Surgery

## 2014-07-18 ENCOUNTER — Encounter: Payer: Self-pay | Admitting: *Deleted

## 2014-07-18 DIAGNOSIS — E119 Type 2 diabetes mellitus without complications: Secondary | ICD-10-CM | POA: Diagnosis not present

## 2014-07-18 DIAGNOSIS — M75102 Unspecified rotator cuff tear or rupture of left shoulder, not specified as traumatic: Secondary | ICD-10-CM | POA: Diagnosis present

## 2014-07-18 DIAGNOSIS — Z7901 Long term (current) use of anticoagulants: Secondary | ICD-10-CM | POA: Diagnosis not present

## 2014-07-18 DIAGNOSIS — M75112 Incomplete rotator cuff tear or rupture of left shoulder, not specified as traumatic: Secondary | ICD-10-CM | POA: Diagnosis not present

## 2014-07-18 DIAGNOSIS — I509 Heart failure, unspecified: Secondary | ICD-10-CM | POA: Insufficient documentation

## 2014-07-18 DIAGNOSIS — Z79891 Long term (current) use of opiate analgesic: Secondary | ICD-10-CM | POA: Diagnosis not present

## 2014-07-18 DIAGNOSIS — E785 Hyperlipidemia, unspecified: Secondary | ICD-10-CM | POA: Diagnosis not present

## 2014-07-18 DIAGNOSIS — S46212A Strain of muscle, fascia and tendon of other parts of biceps, left arm, initial encounter: Secondary | ICD-10-CM | POA: Insufficient documentation

## 2014-07-18 DIAGNOSIS — M1990 Unspecified osteoarthritis, unspecified site: Secondary | ICD-10-CM | POA: Diagnosis not present

## 2014-07-18 DIAGNOSIS — Z79899 Other long term (current) drug therapy: Secondary | ICD-10-CM | POA: Diagnosis not present

## 2014-07-18 DIAGNOSIS — Z87891 Personal history of nicotine dependence: Secondary | ICD-10-CM | POA: Diagnosis not present

## 2014-07-18 DIAGNOSIS — I4891 Unspecified atrial fibrillation: Secondary | ICD-10-CM | POA: Insufficient documentation

## 2014-07-18 DIAGNOSIS — X58XXXA Exposure to other specified factors, initial encounter: Secondary | ICD-10-CM | POA: Insufficient documentation

## 2014-07-18 DIAGNOSIS — M19012 Primary osteoarthritis, left shoulder: Secondary | ICD-10-CM | POA: Diagnosis not present

## 2014-07-18 HISTORY — PX: SHOULDER ARTHROSCOPY WITH OPEN ROTATOR CUFF REPAIR AND DISTAL CLAVICLE ACROMINECTOMY: SHX5683

## 2014-07-18 LAB — GLUCOSE, CAPILLARY: Glucose-Capillary: 195 mg/dL — ABNORMAL HIGH (ref 65–99)

## 2014-07-18 SURGERY — SHOULDER ARTHROSCOPY WITH OPEN ROTATOR CUFF REPAIR AND DISTAL CLAVICLE ACROMINECTOMY
Anesthesia: Regional | Laterality: Left | Wound class: Clean

## 2014-07-18 MED ORDER — LACTATED RINGERS IV SOLN
INTRAVENOUS | Status: DC | PRN
  Administered 2014-07-18: 15:00:00 via INTRAVENOUS

## 2014-07-18 MED ORDER — FENTANYL CITRATE (PF) 100 MCG/2ML IJ SOLN: 25.0000 ug | INTRAMUSCULAR | Status: DC | PRN

## 2014-07-18 MED ORDER — MIDAZOLAM HCL 2 MG/2ML IJ SOLN
INTRAMUSCULAR | Status: DC | PRN
  Administered 2014-07-18: 2 mg via INTRAVENOUS

## 2014-07-18 MED ORDER — LIDOCAINE HCL (PF) 1 % IJ SOLN
INTRAMUSCULAR | Status: AC
  Filled 2014-07-18: qty 30

## 2014-07-18 MED ORDER — CEFAZOLIN SODIUM-DEXTROSE 2-3 GM-% IV SOLR
INTRAVENOUS | Status: AC
  Filled 2014-07-18: qty 50

## 2014-07-18 MED ORDER — EPINEPHRINE HCL 1 MG/ML IJ SOLN
INTRAMUSCULAR | Status: AC
  Filled 2014-07-18: qty 1

## 2014-07-18 MED ORDER — SODIUM CHLORIDE 0.9 % IV SOLN
INTRAVENOUS | Status: DC
  Administered 2014-07-18: 12:00:00 via INTRAVENOUS

## 2014-07-18 MED ORDER — EPHEDRINE SULFATE 50 MG/ML IJ SOLN
INTRAMUSCULAR | Status: DC | PRN
  Administered 2014-07-18: 10 mg via INTRAVENOUS
  Administered 2014-07-18: 5 mg via INTRAVENOUS

## 2014-07-18 MED ORDER — HYDROMORPHONE HCL 1 MG/ML IJ SOLN: 0.2500 mg | INTRAMUSCULAR | Status: DC | PRN

## 2014-07-18 MED ORDER — BUPIVACAINE HCL (PF) 0.25 % IJ SOLN
INTRAMUSCULAR | Status: AC
  Filled 2014-07-18: qty 30

## 2014-07-18 MED ORDER — ACETAMINOPHEN 10 MG/ML IV SOLN
INTRAVENOUS | Status: DC | PRN
Start: 2014-07-18 — End: 2014-07-18
  Administered 2014-07-18: 1000 mg via INTRAVENOUS

## 2014-07-18 MED ORDER — FENTANYL CITRATE (PF) 100 MCG/2ML IJ SOLN
50.0000 ug | Freq: Once | INTRAMUSCULAR | Status: AC
  Administered 2014-07-18: 50 ug via INTRAVENOUS

## 2014-07-18 MED ORDER — ROPIVACAINE HCL 5 MG/ML IJ SOLN
INTRAMUSCULAR | Status: AC
  Administered 2014-07-18: 20 mL via PERINEURAL
  Filled 2014-07-18: qty 20

## 2014-07-18 MED ORDER — EPINEPHRINE HCL 1 MG/ML IJ SOLN
INTRAMUSCULAR | Status: DC | PRN
  Administered 2014-07-18: 1 mg

## 2014-07-18 MED ORDER — MIDAZOLAM HCL 5 MG/5ML IJ SOLN
1.0000 mg | Freq: Once | INTRAMUSCULAR | Status: AC
  Administered 2014-07-18: 1 mg via INTRAVENOUS

## 2014-07-18 MED ORDER — MIDAZOLAM HCL 5 MG/5ML IJ SOLN
INTRAMUSCULAR | Status: AC
  Administered 2014-07-18: 1 mg via INTRAVENOUS
  Filled 2014-07-18: qty 5

## 2014-07-18 MED ORDER — FENTANYL CITRATE (PF) 100 MCG/2ML IJ SOLN
INTRAMUSCULAR | Status: DC | PRN
  Administered 2014-07-18 (×2): 50 ug via INTRAVENOUS
  Administered 2014-07-18: 100 ug via INTRAVENOUS

## 2014-07-18 MED ORDER — NEOMYCIN-POLYMYXIN B GU 40-200000 IR SOLN
Status: DC | PRN
  Administered 2014-07-18: 2 mL

## 2014-07-18 MED ORDER — FENTANYL CITRATE (PF) 100 MCG/2ML IJ SOLN
INTRAMUSCULAR | Status: AC
  Administered 2014-07-18: 50 ug via INTRAVENOUS
  Filled 2014-07-18: qty 2

## 2014-07-18 MED ORDER — OXYCODONE HCL 5 MG PO TABS: 5.0000 mg | ORAL_TABLET | ORAL | Status: AC | PRN

## 2014-07-18 MED ORDER — NEOMYCIN-POLYMYXIN B GU 40-200000 IR SOLN
Status: AC
  Filled 2014-07-18: qty 2

## 2014-07-18 MED ORDER — SUCCINYLCHOLINE CHLORIDE 20 MG/ML IJ SOLN
INTRAMUSCULAR | Status: DC | PRN
Start: 2014-07-18 — End: 2014-07-18
  Administered 2014-07-18: 100 mg via INTRAVENOUS

## 2014-07-18 MED ORDER — PROMETHAZINE HCL 12.5 MG PO TABS: 12.5000 mg | ORAL_TABLET | ORAL | Status: DC | PRN

## 2014-07-18 MED ORDER — ACETAMINOPHEN 10 MG/ML IV SOLN
INTRAVENOUS | Status: AC
  Filled 2014-07-18: qty 100

## 2014-07-18 MED ORDER — PROMETHAZINE HCL 12.5 MG PO TABS: 12.5000 mg | ORAL_TABLET | ORAL | Status: AC | PRN

## 2014-07-18 MED ORDER — DEXAMETHASONE SODIUM PHOSPHATE 4 MG/ML IJ SOLN
INTRAMUSCULAR | Status: DC | PRN
  Administered 2014-07-18: 5 mg via INTRAVENOUS

## 2014-07-18 MED ORDER — ONDANSETRON HCL 4 MG/2ML IJ SOLN
4.0000 mg | Freq: Once | INTRAMUSCULAR | Status: DC | PRN
Start: 2014-07-18 — End: 2014-07-18

## 2014-07-18 MED ORDER — ACETAMINOPHEN 325 MG PO TABS: 650.0000 mg | ORAL_TABLET | Freq: Four times a day (QID) | ORAL | Status: AC | PRN

## 2014-07-18 MED ORDER — ONDANSETRON HCL 4 MG/2ML IJ SOLN
INTRAMUSCULAR | Status: DC | PRN
  Administered 2014-07-18 (×2): 4 mg via INTRAVENOUS

## 2014-07-18 MED ORDER — PROPOFOL 10 MG/ML IV BOLUS
INTRAVENOUS | Status: DC | PRN
  Administered 2014-07-18: 200 mg via INTRAVENOUS

## 2014-07-18 SURGICAL SUPPLY — 66 items
ADAPTER IRRIG TUBE 2 SPIKE SOL (ADAPTER) ×6 IMPLANT
ANCHOR DBLROW ROT CUFF 2.8 MAG (Anchor) ×6 IMPLANT
ANCHOR SUT PEEK 3.0 ST 3 (SUTURE) IMPLANT
ANCHOR SUT PEEK 3.0MM ST 3MM (SUTURE)
BUR RADIUS 4.0X18.5 (BURR) IMPLANT
BUR RADIUS 5.5 (BURR) IMPLANT
CANNULA 5.75X7 CRYSTAL CLEAR (CANNULA) IMPLANT
CANNULA PARTIAL THREAD 2X7 (CANNULA) IMPLANT
CANNULA TWIST IN 8.25X9CM (CANNULA) ×3 IMPLANT
CLOSURE WOUND 1/2 X4 (GAUZE/BANDAGES/DRESSINGS) ×1
CONNECTOR M SMARTSTITCH (Connector) ×3 IMPLANT
COOLER POLAR GLACIER W/PUMP (MISCELLANEOUS) ×3 IMPLANT
DRAPE IMP U-DRAPE 54X76 (DRAPES) ×6 IMPLANT
DRAPE INCISE IOBAN 66X45 STRL (DRAPES) ×3 IMPLANT
DRAPE U-SHAPE 47X51 STRL (DRAPES) ×3 IMPLANT
DURAPREP 26ML APPLICATOR (WOUND CARE) ×9 IMPLANT
GAUZE PETRO XEROFOAM 1X8 (MISCELLANEOUS) ×3 IMPLANT
GAUZE SPONGE 4X4 12PLY STRL (GAUZE/BANDAGES/DRESSINGS) ×3 IMPLANT
GLOVE BIOGEL PI IND STRL 9 (GLOVE) ×1 IMPLANT
GLOVE BIOGEL PI INDICATOR 9 (GLOVE) ×2
GLOVE SURG 9.0 ORTHO LTXF (GLOVE) ×9 IMPLANT
GOWN STRL REUS TWL 2XL XL LVL4 (GOWN DISPOSABLE) ×3 IMPLANT
GOWN STRL REUS W/ TWL LRG LVL3 (GOWN DISPOSABLE) IMPLANT
GOWN STRL REUS W/ TWL LRG LVL4 (GOWN DISPOSABLE) ×1 IMPLANT
GOWN STRL REUS W/TWL LRG LVL3 (GOWN DISPOSABLE)
GOWN STRL REUS W/TWL LRG LVL4 (GOWN DISPOSABLE) ×2
IV LACTATED RINGER IRRG 3000ML (IV SOLUTION) ×26
IV LR IRRIG 3000ML ARTHROMATIC (IV SOLUTION) ×13 IMPLANT
KIT RM TURNOVER STRD PROC AR (KITS) ×3 IMPLANT
KIT STABILIZATION SHOULDER (MISCELLANEOUS) ×3 IMPLANT
MANIFOLD NEPTUNE II (INSTRUMENTS) ×3 IMPLANT
MASK FACE SPIDER DISP (MASK) ×3 IMPLANT
MAT BLUE FLOOR 46X72 FLO (MISCELLANEOUS) ×6 IMPLANT
NDL SAFETY 18GX1.5 (NEEDLE) ×3 IMPLANT
NDL SAFETY 22GX1.5 (NEEDLE) ×3 IMPLANT
NS IRRIG 500ML POUR BTL (IV SOLUTION) ×3 IMPLANT
PACK ARTHROSCOPY SHOULDER (MISCELLANEOUS) ×3 IMPLANT
PAD GROUND ADULT SPLIT (MISCELLANEOUS) ×3 IMPLANT
PAD WRAPON POLAR SHDR UNIV (MISCELLANEOUS) ×1 IMPLANT
PASSER SUT CAPTURE FIRST (SUTURE) ×3 IMPLANT
SET TUBE SUCT SHAVER OUTFL 24K (TUBING) ×3 IMPLANT
SET TUBE TIP INTRA-ARTICULAR (MISCELLANEOUS) ×3 IMPLANT
SLING ULTRA II LG (MISCELLANEOUS) ×3 IMPLANT
SLING ULTRA II M (MISCELLANEOUS) IMPLANT
STRIP CLOSURE SKIN 1/2X4 (GAUZE/BANDAGES/DRESSINGS) ×2 IMPLANT
SUT CO BRAID (SUTURE) ×15 IMPLANT
SUT ETHILON 4-0 (SUTURE) ×2
SUT ETHILON 4-0 FS2 18XMFL BLK (SUTURE) ×1
SUT KNTLS 2.8 MAGNUM (Anchor) ×12 IMPLANT
SUT LASSO 90 DEG SD STR (SUTURE) ×3 IMPLANT
SUT MNCRL 4-0 (SUTURE) ×2
SUT MNCRL 4-0 27XMFL (SUTURE) ×1
SUT PDS AB 0 CT1 27 (SUTURE) ×9 IMPLANT
SUT VIC AB 0 CT1 36 (SUTURE) ×9 IMPLANT
SUT VIC AB 2-0 CT2 27 (SUTURE) ×3 IMPLANT
SUTURE ETHLN 4-0 FS2 18XMF BLK (SUTURE) ×1 IMPLANT
SUTURE MAGNUM WIRE 2X48 BLK (SUTURE) ×6 IMPLANT
SUTURE MNCRL 4-0 27XMF (SUTURE) ×1 IMPLANT
SUTURE OPUS MAGNUM SZ 2 WHT (SUTURE) ×18 IMPLANT
SYRINGE 10CC LL (SYRINGE) ×3 IMPLANT
TAPE MICROFOAM 4IN (TAPE) ×3 IMPLANT
TUBING ARTHRO INFLOW-ONLY STRL (TUBING) ×3 IMPLANT
TUBING CONNECTING 10 (TUBING) ×2 IMPLANT
TUBING CONNECTING 10' (TUBING) ×1
WAND HAND CNTRL MULTIVAC 90 (MISCELLANEOUS) ×3 IMPLANT
WRAPON POLAR PAD SHDR UNIV (MISCELLANEOUS) ×3

## 2014-07-18 NOTE — Transfer of Care (Signed)
Immediate Anesthesia Transfer of Care Note  Patient: Lawanda CousinsMark P Reinecke  Procedure(s) Performed: Procedure(s): SHOULDER ARTHROSCOPY WITH OPEN ROTATOR CUFF REPAIR AND DISTAL CLAVICLE ACROMINECTOMY MINI BICEPS TENODESIS (Left)  Patient Location: PACU  Anesthesia Type:General  Level of Consciousness: Alert, Awake, Oriented  Airway & Oxygen Therapy: Patient Spontanous Breathing  Post-op Assessment: Report given to RN  Post vital signs: Reviewed and stable  Last Vitals:  Filed Vitals:   07/18/14 1710  BP: 122/79  Pulse: 60  Temp: 36.3 C  Resp: 12    Complications: No apparent anesthesia complications

## 2014-07-18 NOTE — OR Nursing (Signed)
Medications given in pacu for isnb were 1 mg versed and of Fentanyl.

## 2014-07-18 NOTE — H&P (Signed)
PREOPERATIVE H&P  Chief Complaint:   left shoulder full thickness rotator cuff tear.  HPI: David Dorsey is a 54 y.o. male who presents for preoperative history and physical with a diagnosis of left shoulder full thickness rotator cuff tear.  He has weakness and pain with overhead and abduction activity.Symptoms are rated as moderate to severe, and  affect his ability to perform activities of daily living and perform at work.  He has elected for surgical management.   Past Medical History  Diagnosis Date  . Diabetes mellitus   . Type II or unspecified type diabetes mellitus without mention of complication, not stated as uncontrolled   . Personal history of colonic polyps   . Calculus of ureter   . Other chronic nonalcoholic liver disease   . Hyperlipidemia   . Atrial fibrillation   . CHF (congestive heart failure)   . Arthritis   . Kidney stones    Past Surgical History  Procedure Laterality Date  . Rotator cuff repair  May 23, 2010    right  . Colonoscopy  08/29/2010  . Cardioversion  09/2010  . Hernia repair Left     inguinal hernia repair   History   Social History  . Marital Status: Single    Spouse Name: N/A  . Number of Children: N/A  . Years of Education: N/A   Social History Main Topics  . Smoking status: Former Smoker -- 1.00 packs/day for 15 years    Types: Cigarettes    Quit date: 08/29/1998  . Smokeless tobacco: Never Used  . Alcohol Use: 0.5 oz/week    1 Standard drinks or equivalent per week     Comment: moderate  . Drug Use: No  . Sexual Activity: Not on file   Other Topics Concern  . None   Social History Narrative   Family History  Problem Relation Age of Onset  . Diabetes Father    No Known Allergies Prior to Admission medications   Medication Sig Start Date End Date Taking? Authorizing Provider  amiodarone (PACERONE) 200 MG tablet Take 1 tablet (200 mg total) by mouth 2 (two) times daily. 06/13/14  Yes Antonieta Iba, MD  atorvastatin  (LIPITOR) 10 MG tablet Take 1 tablet (10 mg total) by mouth daily. 06/13/14  Yes Antonieta Iba, MD  carvedilol (COREG) 12.5 MG tablet Take 1 tablet (12.5 mg total) by mouth 2 (two) times daily. 06/13/14  Yes Antonieta Iba, MD  enalapril (VASOTEC) 5 MG tablet Take 1 tablet (5 mg total) by mouth daily. 06/13/14  Yes Antonieta Iba, MD  edoxaban (SAVAYSA) 60 MG TABS tablet Take 60 mg by mouth daily. 06/13/14   Antonieta Iba, MD  HYDROcodone-acetaminophen (NORCO/VICODIN) 5-325 MG per tablet Take 1 tablet by mouth every 6 (six) hours as needed for moderate pain.    Historical Provider, MD  metFORMIN (GLUCOPHAGE-XR) 500 MG 24 hr tablet TAKE 2 TABLETS BY MOUTH TWICE A DAY EVERY DAY 06/13/14   Malva Limes, MD     Positive ROS: All other systems have been reviewed and were otherwise negative with the exception of those mentioned in the HPI and as above.  Physical Exam: General: Alert, no acute distress Cardiovascular: Regular rate and rhythm, no murmurs rubs or gallops.  No pedal edema Respiratory: Clear to auscultation bilaterally, no wheezes rales or rhonchi. No cyanosis, no use of accessory musculature GI: No organomegaly, abdomen is soft and non-tender nondistended with positive bowel sounds. Skin: Skin intact,  no lesions within the operative field. Neurologic: Sensation intact distally Psychiatric: Patient is competent for consent with normal mood and affect Lymphatic: No axillary or cervical lymphadenopathy  MUSCULOSKELETAL:  Shoulder: Patient has pain with left shoulder abduction. He has weakness of the supraspinatus and to a lesser extend the external rotators on the left side. His skin is intact. There is no erythema or ecchymosis or effusion. There is no muscle atrophy or shoulder asymmetry. He has mild tenderness over the before meals joint without step-off. He has full digital wrist and elbow range of motion. He has positive impingement signs but no apprehension or  instability.  Assessment:  left shoulder full thickness rotator cuff tear.  Plan: Plan for Procedure(s): SHOULDER ARTHROSCOPY WITH OPEN ROTATOR CUFF REPAIR AND DISTAL CLAVICLE EXCISION, SUBACROMIAL DECOMPRESSION AND POSSIBLE BICEPS TENODESIS  The patient is having significant pain and limitation in motion of left shoulder. He has elected for surgical repair of his full-thickness rotator cuff tear. We discussed the possibility of biceps tenotomy versus tenodesis if he is found to have a partial biceps tendon tear at the time of surgery. The surgical plan will also include a subacromial decompression and distal clavicle excision. I reviewed all labs and radiographic studies in preparation for this case.  I discussed the risks and benefits of surgery. The risks include but are not limited to infection, bleeding requiring blood transfusion, nerve or blood vessel injury, joint stiffness or loss of motion, persistent pain, weakness or instability, malunion, nonunion and hardware failure and the need for further surgery. Medical risks include but are not limited to DVT and pulmonary embolism, myocardial infarction, stroke, pneumonia, respiratory failure and death. Patient understood these risks and wished to proceed.   Juanell FairlyKRASINSKI, Tanyia Grabbe, MD   07/18/2014 1:04 PM

## 2014-07-18 NOTE — Progress Notes (Signed)
Pt brought in sling with him that had been delivered to his home - sling sent with pt to PACU Pt advises he already has the TENS unit at home

## 2014-07-18 NOTE — OR Nursing (Signed)
Start for procedure was 1225 and stop time was 1245

## 2014-07-18 NOTE — Progress Notes (Signed)
Taken to PACU for block - bay 2

## 2014-07-18 NOTE — Anesthesia Preprocedure Evaluation (Signed)
Anesthesia Evaluation  Patient identified by MRN, date of birth, ID band Patient awake    Reviewed: Allergy & Precautions, NPO status , Patient's Chart, lab work & pertinent test results, reviewed documented beta blocker date and time   History of Anesthesia Complications Negative for: history of anesthetic complications  Airway Mallampati: II  TM Distance: >3 FB Neck ROM: Full    Dental no notable dental hx.    Pulmonary former smoker,  breath sounds clear to auscultation  Pulmonary exam normal       Cardiovascular Exercise Tolerance: Good Normal cardiovascular exam+ dysrhythmias Atrial Fibrillation Rhythm:Regular Rate:Normal     Neuro/Psych negative neurological ROS  negative psych ROS   GI/Hepatic negative GI ROS, Neg liver ROS,   Endo/Other  diabetes, Well Controlled, Type 2  Renal/GU negative Renal ROS  negative genitourinary   Musculoskeletal  (+) Arthritis -, Osteoarthritis,    Abdominal   Peds negative pediatric ROS (+)  Hematology negative hematology ROS (+)   Anesthesia Other Findings   Reproductive/Obstetrics negative OB ROS                             Anesthesia Physical Anesthesia Plan  ASA: III  Anesthesia Plan: Regional and General   Post-op Pain Management:    Induction: Intravenous  Airway Management Planned: Oral ETT  Additional Equipment:   Intra-op Plan:   Post-operative Plan: Extubation in OR  Informed Consent: I have reviewed the patients History and Physical, chart, labs and discussed the procedure including the risks, benefits and alternatives for the proposed anesthesia with the patient or authorized representative who has indicated his/her understanding and acceptance.   Dental advisory given  Plan Discussed with: CRNA and Surgeon  Anesthesia Plan Comments:         Anesthesia Quick Evaluation

## 2014-07-18 NOTE — Anesthesia Procedure Notes (Addendum)
Anesthesia Regional Block:  Interscalene brachial plexus block  Pre-Anesthetic Checklist: ,, timeout performed, Correct Patient, Correct Site, Correct Laterality, Correct Procedure, Correct Position, site marked, Risks and benefits discussed,  Surgical consent,  Pre-op evaluation,  At surgeon's request and post-op pain management  Laterality: Left  Prep: chloraprep and alcohol swabs       Needles:  Injection technique: Single-shot  Needle Type: Stimiplex     Needle Length: 5cm 5 cm Needle Gauge: 22 and 22 G    Additional Needles:  Procedures: ultrasound guided (picture in chart) and nerve stimulator Interscalene brachial plexus block  Nerve Stimulator or Paresthesia:  Response: deltoid twitch, 0.4 mA,   Additional Responses:   Narrative:  Start time: 07/18/2014 12:35 PM End time: 07/18/2014 12:45 PM Injection made incrementally with aspirations every 5 mL.  Performed by: Personally  Anesthesiologist: Forestine ChutePOLIN, CARRIE  Additional Notes: Functioning IV was confirmed and monitors were applied.  A 50mm 22ga Stimuplex needle was used. Sterile prep and drape,hand hygiene and sterile gloves were used.  Negative aspiration and negative test dose prior to incremental administration of local anesthetic using B-smart monitor. The patient tolerated the procedure well.      Procedure Name: Intubation Date/Time: 07/18/2014 1:33 PM Performed by: Derinda LateIACONE, Litzi Binning Pre-anesthesia Checklist: Emergency Drugs available, Patient identified, Timeout performed, Suction available and Patient being monitored Patient Re-evaluated:Patient Re-evaluated prior to inductionOxygen Delivery Method: Circle system utilized Preoxygenation: Pre-oxygenation with 100% oxygen Intubation Type: IV induction Ventilation: Mask ventilation without difficulty Laryngoscope Size: Mac and 3 Grade View: Grade II Tube type: Oral Comments: I would use a Miller 2 in the future.

## 2014-07-18 NOTE — Discharge Instructions (Addendum)

## 2014-07-18 NOTE — Op Note (Signed)
07/18/2014  5:32 PM  PATIENT:  David Dorsey  54 y.o. male  PRE-OPERATIVE DIAGNOSIS:  full thickness rotator cuff tear.  POST-OPERATIVE DIAGNOSIS:  full thickness rotator cuff tear.  PROCEDURE:  Procedure(s): SHOULDER ARTHROSCOPY WITH OPEN ROTATOR CUFF REPAIR AND DISTAL CLAVICLE ACROMINECTOMY MINI BICEPS TENODESIS (Left)  SURGEON:  Surgeon(s) and Role:    * Thornton Park, MD - Primary  ANESTHESIA:   Gen. endotracheal intubation with left interscalene block. 1% lidocaine plain injected into the incision sites at the start of the case and quarter percent Marcaine plain injected into the subacromial space at the conclusion of the case.   PREOPERATIVE INDICATIONS:  David MCCOMBIE is a  54 y.o. male with a diagnosis of full thickness rotator cuff tear with retraction. He has symptoms of pain with overhead and abduction activity and demonstrates significant weakness of the rotator cuff.. Given the acute nature of his injury and his limitations he elected for surgical management.  Patient is moving to [facility] but wished to have surgery prior to his move.  The risks benefits and alternatives were discussed with the patient preoperatively including but not limited to the risks of infection, bleeding, nerve injury, especially the axillary nerve leading to lateral arm numbness and deltoid weakness,, persistent left shoulder pain or weakness, failure of the hardware, re-tear of the rotator cuff and the need for further surgery. Medical risks include DVT and pulmonary embolism, myocardial infarction, stroke, pneumonia, respiratory failure and death. Patient understood these risks and wished to proceed.  OPERATIVE IMPLANTS: [employer] (ArthroCare) Magnum 2 anchors 4 and Magnum M anchors 2  OPERATIVE FINDINGS: Partial tear of the intra-articular biceps involving at least 50% of the diameter of the tendon and full thickness rotator cuff tear involving the supra and infraspinatus with retraction to  the top of the humeral head. Patient had a subacromial spurring and arthrosis of the acromioclavicular joint.   OPERATIVE PROCEDURE: The patient was met in the preoperative area. The operative shoulder was signed with the word yes and my initials according the hospital's correct site of surgery protocol. The patient underwent placement of a left interscalene block by the anesthesia service.  Patient was brought to the operating room where he underwent general endotracheal intubation. he wasplaced in a beachchair position.  A spider arm positioner was used for this case. Examination under anesthesia revealed no limitation of motion or instability with load shift testing. The patient had a negative sulcus sign.  Patient was prepped and draped in a sterile fashion. A timeout was performed to verify the patient's name, date of birth, medical record number, correct site of surgery and correct procedure to be performed there was also used to verify the patient received antibiotics that all appropriate instruments, implants and radiographs studies were available in the room. Once all in attendance were in agreement case began.  Bony landmarks were drawn out with a surgical marker along with proposed arthroscopy incisions. These were pre-injected with 1% lidocaine plain. An 11 blade was used to establish a posterior portal through which the arthroscope was placed in the glenohumeral joint. A full diagnostic examination of the shoulder was performed.  the anterior portal was established under direct visualization with an 18-gauge spinal needle. A 5.75 the medial arthroscopic cannula was placed through this anterior portal.   The intra-articular portion of the biceps tendon was found to have a partial tear involving greater than 50% of the diameter. Therefore the decision was made to perform a tenodesis.  An ArthriCare Smart stitch was placed in the biceps tendon and a tenotomy was performed using an arthroscopic  scissor through the anterior cannula. The suture was then brought out through the anterior portal for later tenodesis. A lateral portal was then established using an 18-gauge spinal needle for localization. 3 ArthroCare Smart stitches were placed along the lateral border of the torn rotator cuff under direct visualization. The cuff was easily mobilized to its insertion on the greater tuberosity. The greater tuberosity was debrided using a 5.5 mm resector shaver blade to remove all remaining foreign fibers of the rotator cuff. The greater tuberosity was debrided until punctate bleeding was seen.   The arthroscope was then placed in the subacromial space. Extensive bursitis was encountered and debrided using a 4-0 resector shaver blade and a 90 ArthroCare wand from the lateral portal. A subacromial decompression was also performed using a 5.5 mm resector shaver blade from the lateral portal.  the 5.5 mm resector shaver blade was then placed through the anterior portal and distal clavicle excision was performed.  All arthroscopic instruments were then removed and the mini-open portion of the procedure began.   A saber-type incision was made along the lateral border of the acromion. The deltoid muscle was identified and split in line with its fibers which allowed visualization of the rotator cuff.  The biceps tendon and its associated tagging suture were brought out through the deltoid split.  The Smart stitches previously placed in the lateral border of the rotator cuff were  also brought out through the deltoid split.   a tonsil clamp was placed on the biceps tendon approximate 20 mm from the end. The intra-articular biceps portion was then resected. A new ArthroCare Smart stitch was placed at the end of the biceps tendon and the lasso type fashion. A tenodesis was performed placing biceps tendon at the top of the bicipital groove with a single Magnum 2 anchor.   Two Magnum M anchors were  then placed at the  articular margin of the humeral head and greater tuberosity. The 8 suture limbs of the Magnum M anchors were passed medially through the rotator cuff using a first pass suture passer.  The Smart stitches  along the lateral border of the rotator cuff were thenwere then anchored to the  greater tuberosity of the humeral head using three Magnum 2 anchors. These anchors were tensioned to allow for anatomic reduction of the rotator cuff to the greater tuberosity footprint.  the medial row repair was then performed using arthroscopic knot tying technique with the Magnum M anchor sutures.  Once all sutures were tied down, arthroscopic images of the  double row repair were taken with the arthroscope both externally and arthroscopically from  the glenohumeral joint.  All incisions were copiously irrigated. The deltoid fascia was repaired using a 0 Vicryl suturean interrupted fashion. .  The subcutaneous tissue of all incisions were closed with a 2-0 Vicryl. Skin closure for the arthroscopic incisions was performed with 4-0 nylon. The skin edges of the saber incision were approximated with a running 4-0 undyed Monocryl.  A dry sterile dressing was applied.  The patient was placed in an abduction sling, with a Polar Care sleeve.  All sharp and instrument counts were correct at the conclusion of the case. I was scrubbed and present for the entire case. I spoke with the patient's family postoperatively to let them know the case had gone without complication and the patient was stable in recovery room.  Timoteo Gaul, MD

## 2014-07-19 ENCOUNTER — Encounter: Payer: Self-pay | Admitting: Orthopedic Surgery

## 2014-07-20 NOTE — Anesthesia Postprocedure Evaluation (Signed)
  Anesthesia Post-op Note  Patient: David CousinsMark P Dorsey  Procedure(s) Performed: Procedure(s): SHOULDER ARTHROSCOPY WITH OPEN ROTATOR CUFF REPAIR AND DISTAL CLAVICLE ACROMINECTOMY MINI BICEPS TENODESIS (Left)  Anesthesia type:Regional, General  Patient location: PACU  Post pain: Pain level controlled  Post assessment: Post-op Vital signs reviewed, Patient's Cardiovascular Status Stable, Respiratory Function Stable, Patent Airway and No signs of Nausea or vomiting  Post vital signs: Reviewed and stable  Last Vitals:  Filed Vitals:   07/18/14 1756  BP: 114/77  Pulse: 60  Temp:   Resp: 12    Level of consciousness: awake, alert  and patient cooperative  Complications: No apparent anesthesia complications

## 2014-08-20 ENCOUNTER — Other Ambulatory Visit: Payer: Self-pay | Admitting: Cardiovascular Disease

## 2014-09-14 ENCOUNTER — Encounter: Payer: Self-pay | Admitting: Cardiovascular Disease

## 2014-09-18 ENCOUNTER — Other Ambulatory Visit: Payer: Self-pay | Admitting: Family Medicine

## 2014-09-18 ENCOUNTER — Telehealth: Payer: Self-pay | Admitting: *Deleted

## 2014-09-18 ENCOUNTER — Other Ambulatory Visit: Payer: Self-pay | Admitting: *Deleted

## 2014-09-18 MED ORDER — METFORMIN HCL ER 500 MG PO TB24: ORAL_TABLET | ORAL | Status: DC

## 2014-09-18 NOTE — Telephone Encounter (Signed)
Pt contacted office for refill request on the following medications:  metFORMIN (GLUCOPHAGE-XR) 500 MG 24 hr tablet.  [facility] [place].  CB#[phone]/MW

## 2014-09-18 NOTE — Telephone Encounter (Signed)
LVM address is not correct for pharmacy. I located [facility]. I need a more specific address to better identify the correct [facility].

## 2014-09-18 NOTE — Telephone Encounter (Signed)
  1. Which medications need to be refilled? Carvedilol 12.5mg , Amiodarone , atorvastatin   2. Which pharmacy is medication to be sent to? [facility] [address] [address] [place] [address]  3. Do they need a 30 day or 90 day supply? 90 day  4. Would they like a call back once the medication has been sent to the pharmacy? Yes please

## 2014-09-19 ENCOUNTER — Other Ambulatory Visit: Payer: Self-pay

## 2014-09-19 MED ORDER — AMIODARONE HCL 200 MG PO TABS: 200.0000 mg | ORAL_TABLET | Freq: Two times a day (BID) | ORAL | Status: DC

## 2014-09-19 MED ORDER — ATORVASTATIN CALCIUM 10 MG PO TABS: 10.0000 mg | ORAL_TABLET | Freq: Every day | ORAL | Status: DC

## 2014-09-19 MED ORDER — CARVEDILOL 12.5 MG PO TABS: 12.5000 mg | ORAL_TABLET | Freq: Two times a day (BID) | ORAL | Status: AC

## 2014-09-19 NOTE — Telephone Encounter (Signed)
Duplicate refill request.

## 2014-09-21 ENCOUNTER — Other Ambulatory Visit: Payer: Self-pay

## 2014-09-21 ENCOUNTER — Telehealth: Payer: Self-pay | Admitting: Family Medicine

## 2014-09-21 MED ORDER — ATORVASTATIN CALCIUM 10 MG PO TABS: 10.0000 mg | ORAL_TABLET | Freq: Every day | ORAL | Status: AC

## 2014-09-21 MED ORDER — EDOXABAN TOSYLATE 60 MG PO TABS: 60.0000 mg | ORAL_TABLET | Freq: Every day | ORAL | Status: AC

## 2014-09-21 MED ORDER — CARVEDILOL 12.5 MG PO TABS: 12.5000 mg | ORAL_TABLET | Freq: Two times a day (BID) | ORAL | Status: AC

## 2014-09-21 MED ORDER — ENALAPRIL MALEATE 5 MG PO TABS: 5.0000 mg | ORAL_TABLET | Freq: Every day | ORAL | Status: DC

## 2014-09-21 MED ORDER — AMIODARONE HCL 200 MG PO TABS: 200.0000 mg | ORAL_TABLET | Freq: Two times a day (BID) | ORAL | Status: DC

## 2014-09-21 NOTE — Telephone Encounter (Signed)
[facility] [place] [facility]. Advised pharmacy that rx for metformin has been increased to 2 tablets twice a day and that the patient will be out of his medication by 09/25/2014. Pharmacist stated that she will contact pt's insurance company and try to get an over-ride for the increase of metformin per day.

## 2014-09-21 NOTE — Telephone Encounter (Signed)
Sp[facility] with [facility] [place], she stated that pt's prescription will be ready to pick up on 10/13/2014. She said that he still has 20 pills more? She stated that he had picked up some recently. Will try to call pt.  Thanks,

## 2014-09-21 NOTE — Telephone Encounter (Signed)
Pt would like [place] to contact [facility] about his RX for metFORMIN (GLUCOPHAGE-XR) 500 MG 24 hr tablet that was sent on 09/18/14. [facility] stated that [facility] told [facility]'t receive the [facility]. Thanks TNP

## 2014-09-24 ENCOUNTER — Other Ambulatory Visit: Payer: Self-pay | Admitting: *Deleted

## 2014-09-24 MED ORDER — AMIODARONE HCL 200 MG PO TABS: 200.0000 mg | ORAL_TABLET | Freq: Two times a day (BID) | ORAL | Status: AC

## 2014-10-06 ENCOUNTER — Other Ambulatory Visit: Payer: Self-pay | Admitting: Family Medicine

## 2014-10-24 ENCOUNTER — Other Ambulatory Visit: Payer: Self-pay | Admitting: *Deleted

## 2014-11-19 ENCOUNTER — Other Ambulatory Visit: Payer: Self-pay | Admitting: Cardiovascular Disease

## 2015-11-17 ENCOUNTER — Other Ambulatory Visit: Payer: Self-pay | Admitting: Cardiovascular Disease

## 2016-07-28 ENCOUNTER — Telehealth: Payer: Self-pay | Admitting: Cardiovascular Disease

## 2016-07-28 NOTE — Telephone Encounter (Signed)
We have not seen him since April 2016 We are using less savaysa, Changing people to eliquis 5 twice a day His new doctors can make that change They should have a coupon to make it cost effective

## 2016-07-28 NOTE — Telephone Encounter (Signed)
Pt calling stating [facility] [place] [facility] and [place] They were reviewing his medications and was told that Amiodarone and Savaysa may have an adverse reaction between the two.Marland Kitchen. He is not sure if that is correct but would like to just make sure.  Please advise

## 2016-07-28 NOTE — Telephone Encounter (Signed)
Left message on pt's vm w/ Dr. Windell HummingbirdGollan's recommendation.  Asked him to call back if we can be of further assistance.

## 2016-09-05 IMAGING — CR DG HAND COMPLETE 3+V*L*
1 series · 3 of 3 positions shown · non-contrast
Comparison: None.

CLINICAL DATA: 54-year-old male who fell 6 days ago. Metacarpal
pain and swelling. Initial encounter.

EXAM:
LEFT HAND - COMPLETE 3+ VIEW

[Series 1: kdxr hand lt complete w/obliques · 0.14mm/px · 3 of 3 slices shown]
[im 1/3]
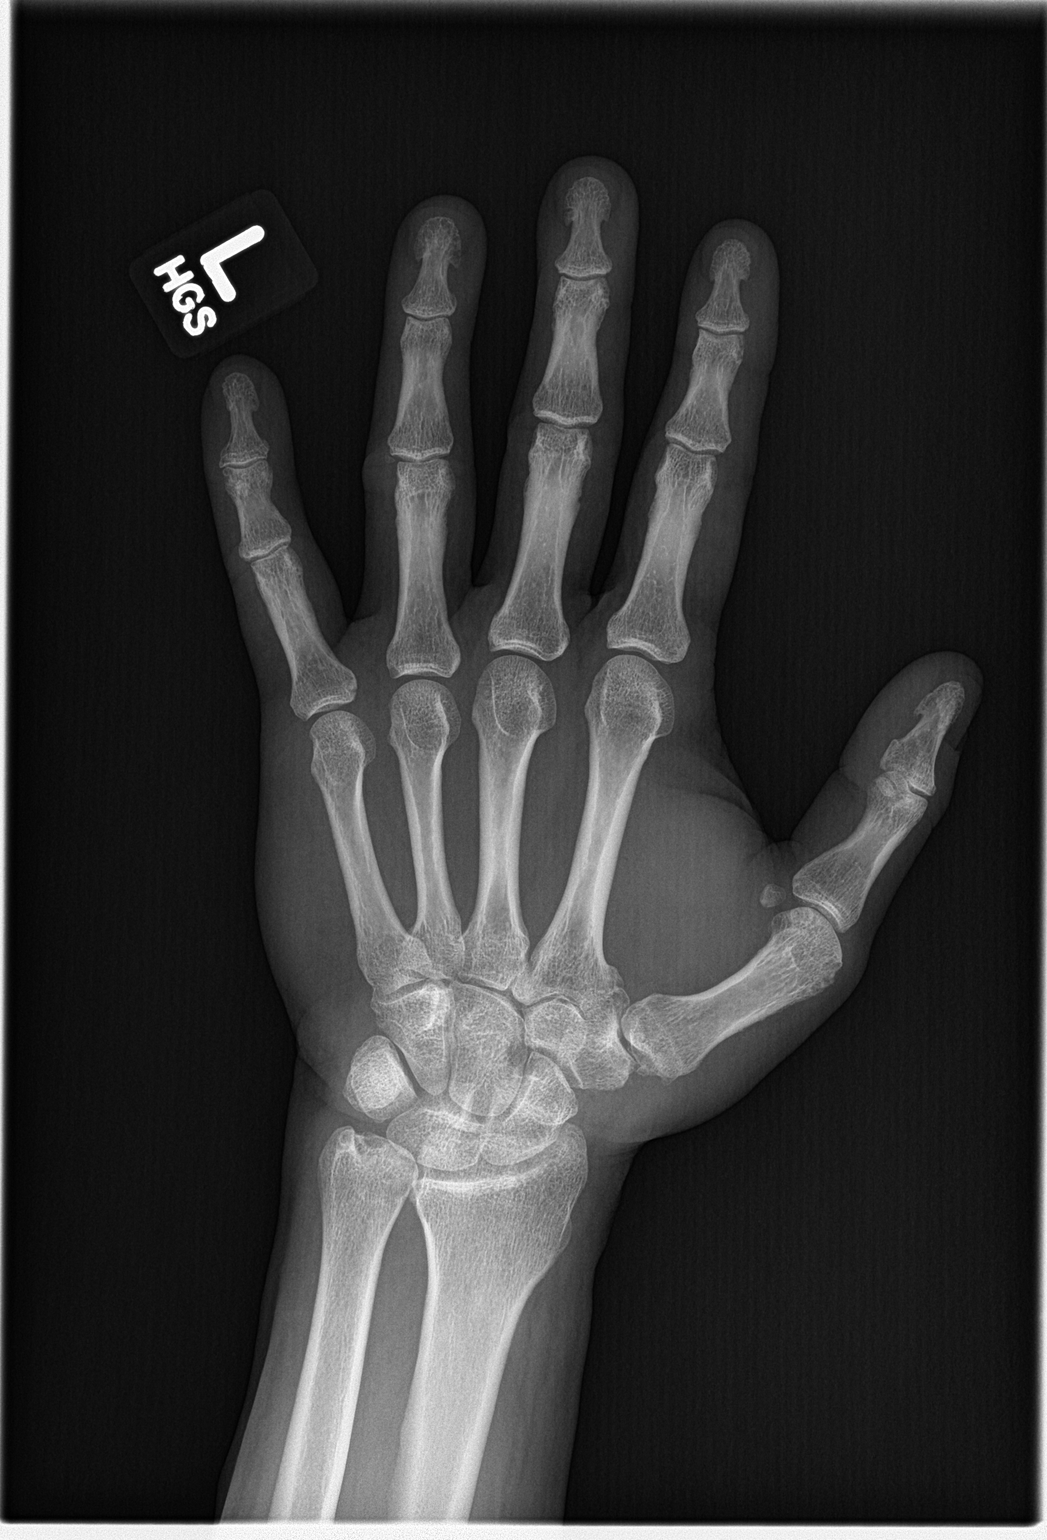
[im 2/3]
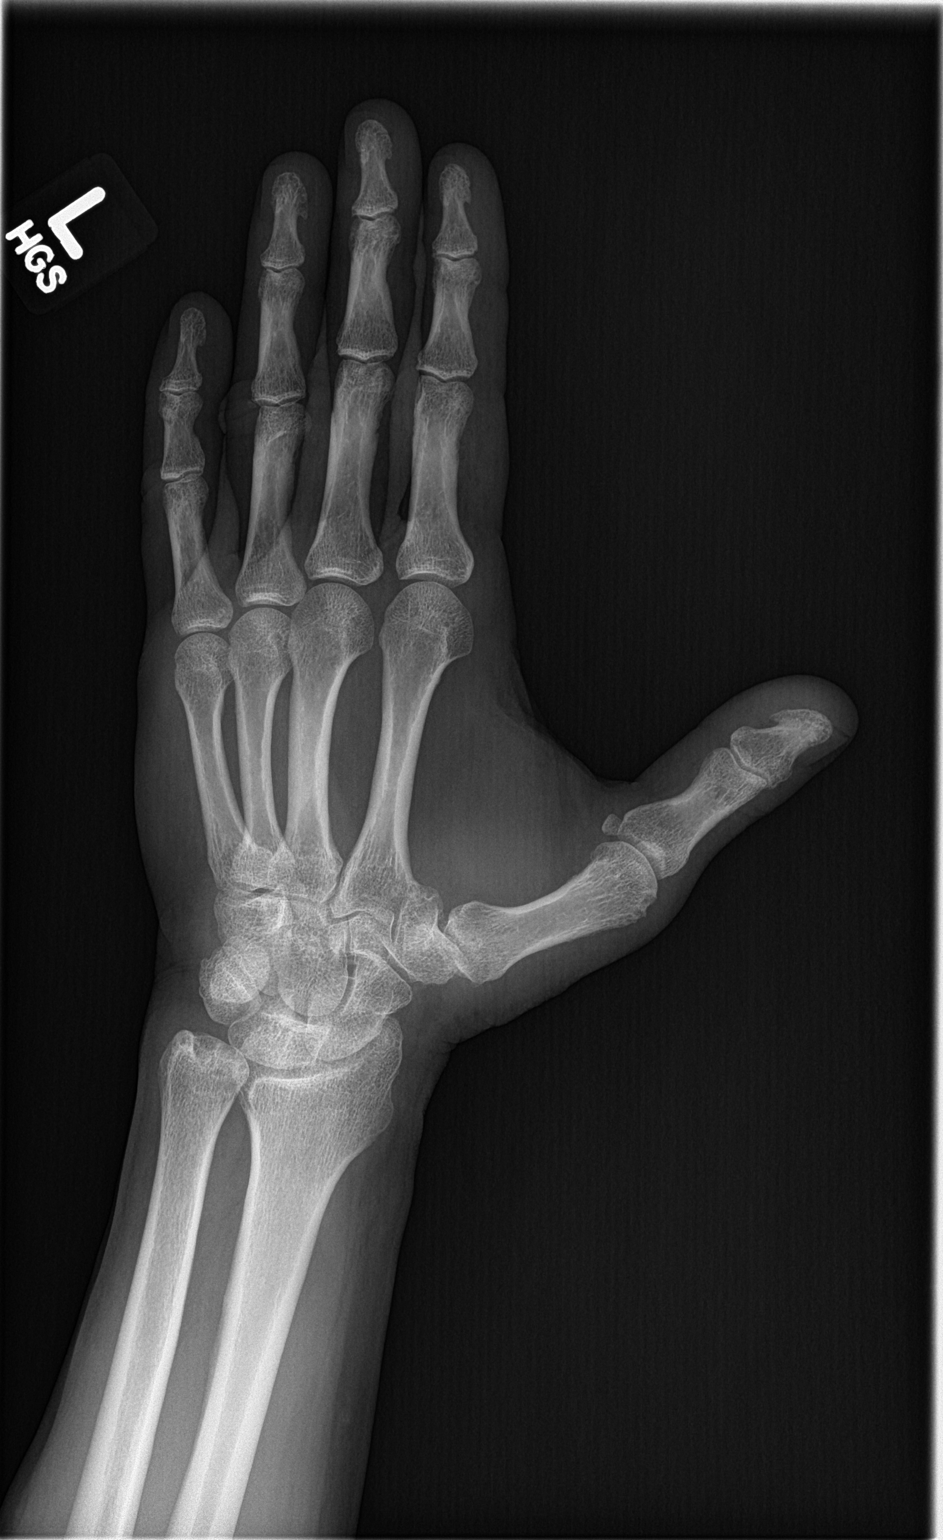
[im 3/3]
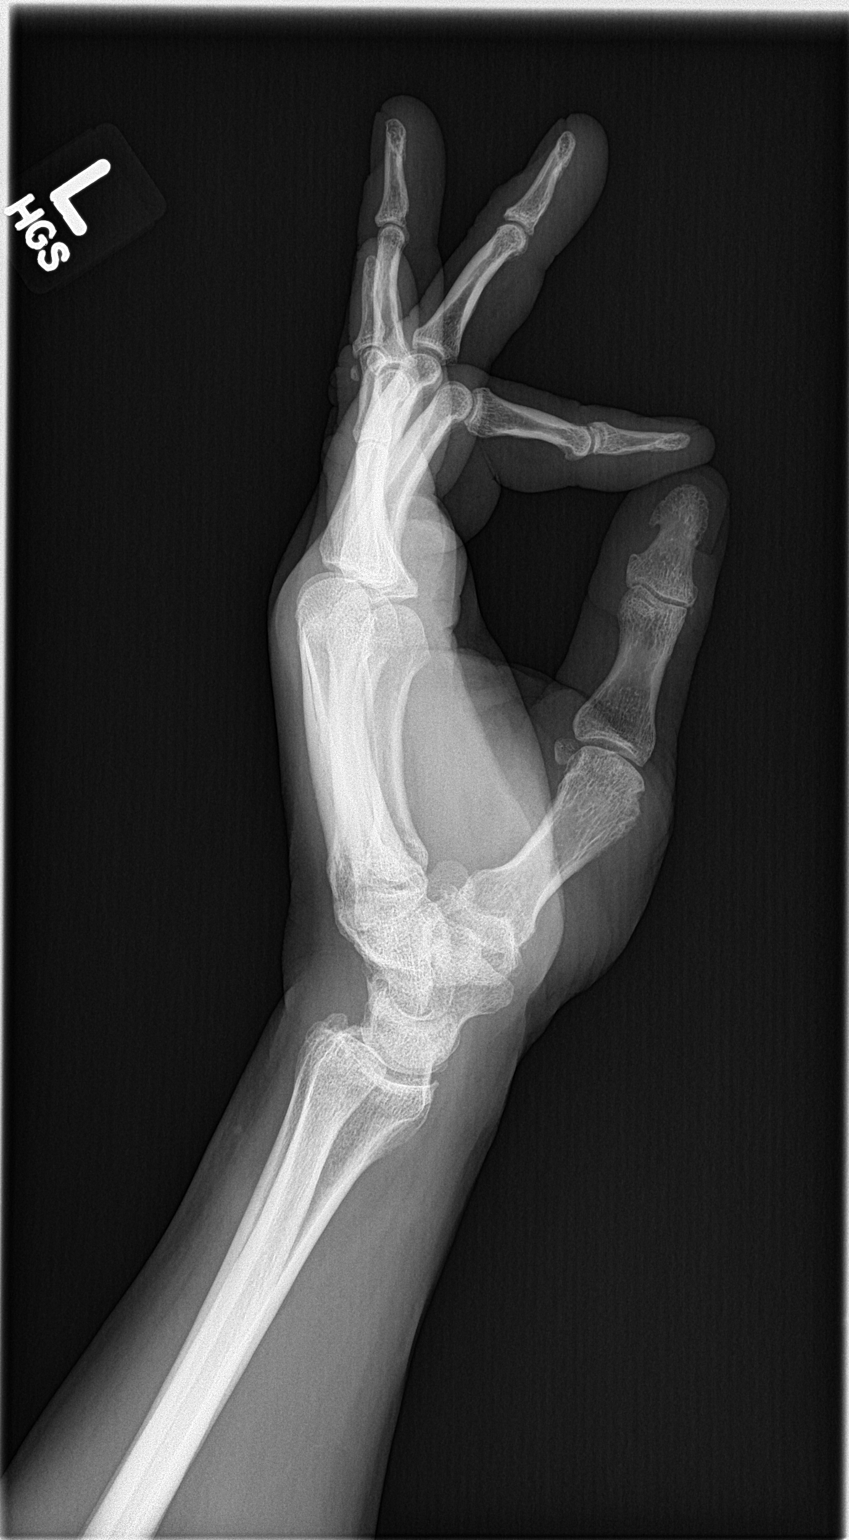

[3 of 3 positions shown; findings below may reference images not displayed]

FINDINGS: Bone mineralization is within normal limits for age. Distal radius
and ulna appear intact. Carpal bone alignment within normal limits.
Metacarpals intact. Phalanges intact. Mild distal joint space loss
and degenerative spurring.
IMPRESSION: No acute fracture or dislocation identified about the left hand.

## 2018-07-25 ENCOUNTER — Encounter: Payer: Self-pay | Admitting: General Surgery

## 2018-08-09 ENCOUNTER — Ambulatory Visit: Payer: Self-pay | Admitting: General Surgery
